# Patient Record
Sex: Female | Born: 1970 | Race: White | Hispanic: No | Smoking: Never smoker
Health system: Southern US, Community
[De-identification: ages and names within clinical notes are randomized; demographics above are authoritative.]

## PROBLEM LIST (undated history)

## (undated) DIAGNOSIS — N939 Abnormal uterine and vaginal bleeding, unspecified: Secondary | ICD-10-CM

## (undated) DIAGNOSIS — A4902 Methicillin resistant Staphylococcus aureus infection, unspecified site: Secondary | ICD-10-CM

## (undated) DIAGNOSIS — E079 Disorder of thyroid, unspecified: Secondary | ICD-10-CM

## (undated) DIAGNOSIS — Z973 Presence of spectacles and contact lenses: Secondary | ICD-10-CM

## (undated) DIAGNOSIS — I10 Essential (primary) hypertension: Secondary | ICD-10-CM

## (undated) DIAGNOSIS — N840 Polyp of corpus uteri: Secondary | ICD-10-CM

## (undated) DIAGNOSIS — D509 Iron deficiency anemia, unspecified: Secondary | ICD-10-CM

## (undated) DIAGNOSIS — E119 Type 2 diabetes mellitus without complications: Secondary | ICD-10-CM

## (undated) HISTORY — PX: TUBAL LIGATION: SHX77

---

## 2012-12-22 ENCOUNTER — Encounter (HOSPITAL_BASED_OUTPATIENT_CLINIC_OR_DEPARTMENT_OTHER): Payer: Self-pay | Admitting: *Deleted

## 2012-12-22 ENCOUNTER — Emergency Department (HOSPITAL_BASED_OUTPATIENT_CLINIC_OR_DEPARTMENT_OTHER)
Admission: EM | Admit: 2012-12-22 | Discharge: 2012-12-22 | Disposition: A | Discharge status: Home / Self Care | Payer: Self-pay | Attending: Emergency Medicine | Admitting: Emergency Medicine

## 2012-12-22 DIAGNOSIS — E079 Disorder of thyroid, unspecified: Secondary | ICD-10-CM | POA: Insufficient documentation

## 2012-12-22 DIAGNOSIS — H18891 Other specified disorders of cornea, right eye: Secondary | ICD-10-CM

## 2012-12-22 DIAGNOSIS — I10 Essential (primary) hypertension: Secondary | ICD-10-CM | POA: Insufficient documentation

## 2012-12-22 DIAGNOSIS — E119 Type 2 diabetes mellitus without complications: Secondary | ICD-10-CM | POA: Insufficient documentation

## 2012-12-22 DIAGNOSIS — Z79899 Other long term (current) drug therapy: Secondary | ICD-10-CM | POA: Insufficient documentation

## 2012-12-22 DIAGNOSIS — R51 Headache: Secondary | ICD-10-CM | POA: Insufficient documentation

## 2012-12-22 DIAGNOSIS — H18899 Other specified disorders of cornea, unspecified eye: Secondary | ICD-10-CM | POA: Insufficient documentation

## 2012-12-22 HISTORY — DX: Disorder of thyroid, unspecified: E07.9

## 2012-12-22 HISTORY — DX: Type 2 diabetes mellitus without complications: E11.9

## 2012-12-22 HISTORY — DX: Essential (primary) hypertension: I10

## 2012-12-22 MED ORDER — IBUPROFEN 800 MG PO TABS
800.0000 mg | ORAL_TABLET | Freq: Once | ORAL | Status: AC
  Administered 2012-12-22: 800 mg via ORAL
  Filled 2012-12-22: qty 1

## 2012-12-22 MED ORDER — TETRACAINE HCL 0.5 % OP SOLN
2.0000 [drp] | Freq: Once | OPHTHALMIC | Status: AC
  Administered 2012-12-22: 2 [drp] via OPHTHALMIC
  Filled 2012-12-22: qty 2

## 2012-12-22 MED ORDER — IBUPROFEN 800 MG PO TABS: 800.0000 mg | ORAL_TABLET | Freq: Three times a day (TID) | ORAL | Status: AC | PRN

## 2012-12-22 MED ORDER — FLUORESCEIN SODIUM 1 MG OP STRP
1.0000 | ORAL_STRIP | Freq: Once | OPHTHALMIC | Status: AC
  Administered 2012-12-22: 1 via OPHTHALMIC
  Filled 2012-12-22: qty 1

## 2012-12-22 MED ORDER — CIPROFLOXACIN HCL 0.3 % OP SOLN: 1.0000 [drp] | OPHTHALMIC | Status: DC

## 2012-12-22 NOTE — ED Notes (Signed)
Pt c/o right eye pain x 4 days

## 2012-12-22 NOTE — ED Provider Notes (Signed)
  CSN: [id number]     Arrival date & time 12/22/12  1824 History     First MD Initiated Contact with Patient 12/22/12 1834     Chief Complaint  Patient presents with  . Eye Pain   (Consider location/radiation/quality/duration/timing/severity/associated sxs/prior Treatment) HPI Patient presents to the emergency department with right eye pain for the last 4 days.  Patient, states, that she's not had any injury to that eye.  Patient does not wear contact lenses.  Patient, states, that there is no drainage or crusting.  Patient, states, that she didn't take any medications prior to arrival.  Patient, states, that she had a previous infection of her eye that was similar to this.  patient, states she does have a headache, associated with the pain.  Patient denies nausea, vomiting, dizziness, weakness, blurred vision, neck pain, fever, or syncope. Past Medical History  Diagnosis Date  . Diabetes mellitus without complication   . Thyroid disease   . Hypertension    History reviewed. No pertinent past surgical history. History reviewed. No pertinent family history. History  Substance Use Topics  . Smoking status: Never Smoker   . Smokeless tobacco: Not on file  . Alcohol Use: No   OB History   Grav Para Term Preterm Abortions TAB SAB Ect Mult Living                 Review of Systems All other systems negative except as documented in the HPI. All pertinent positives and negatives as reviewed in the HPI. Allergies  Shellfish allergy and Sulfa antibiotics  Home Medications   Current Outpatient Rx  Name  Route  Sig  Dispense  Refill  . levothyroxine (SYNTHROID, LEVOTHROID) 100 MCG tablet   Oral   Take 100 mcg by mouth daily before breakfast.         . lisinopril-hydrochlorothiazide (PRINZIDE,ZESTORETIC) 20-25 MG per tablet   Oral   Take 1 tablet by mouth daily.         . metFORMIN (GLUCOPHAGE) 500 MG tablet   Oral   Take 500 mg by mouth 3 (three) times daily.          BP  124/77  Pulse 100  Temp(Src) 98.8 F (37.1 C)  Resp 18  Ht 5\' 5"  (1.651 m)  Wt 165 lb (74.844 kg)  BMI 27.46 kg/m2  SpO2 100%  LMP 12/22/2012 Physical Exam  Nursing note and vitals reviewed. Constitutional: She appears well-developed and well-nourished.  HENT:  Head: Normocephalic and atraumatic.  Eyes: EOM and lids are normal. Pupils are equal, round, and reactive to light. No foreign bodies found. Right eye exhibits no discharge and no exudate. Left eye exhibits no discharge and no exudate. Right conjunctiva is injected. Left conjunctiva is not injected.  Slit lamp exam:      The right eye shows fluorescein uptake. The right eye shows no corneal ulcer, no foreign body and no hyphema.    Cardiovascular: Normal rate, regular rhythm and normal heart sounds.   Pulmonary/Chest: Effort normal and breath sounds normal.  Skin: Skin is warm and dry. No rash noted.    ED Course   Procedures (including critical care time) The patient be referred to ophthalmology for further evaluation and, care.  She'll be placed on antibiotic eyedrops.  Told to return here as needed  MDM    Carlyle Dolly, PA-C 12/22/12 1909

## 2012-12-22 NOTE — ED Provider Notes (Signed)
Medical screening examination/treatment/procedure(s) were performed by non-physician practitioner and as supervising physician I was immediately available for consultation/collaboration.   Glynn Octave, MD 12/22/12 2226

## 2012-12-22 NOTE — ED Notes (Signed)
PA at bedside using slit lamp to examine pts eye.

## 2018-08-01 ENCOUNTER — Other Ambulatory Visit: Payer: Self-pay | Admitting: Obstetrics and Gynecology

## 2018-08-04 ENCOUNTER — Other Ambulatory Visit: Payer: Self-pay

## 2018-08-04 ENCOUNTER — Ambulatory Visit (HOSPITAL_COMMUNITY)
Admission: RE | Admit: 2018-08-04 | Discharge: 2018-08-04 | Disposition: A | Discharge status: Home / Self Care | Payer: Managed Care, Other (non HMO) | Source: Ambulatory Visit | Attending: Obstetrics and Gynecology | Admitting: Obstetrics and Gynecology

## 2018-08-04 DIAGNOSIS — D509 Iron deficiency anemia, unspecified: Secondary | ICD-10-CM | POA: Diagnosis present

## 2018-08-04 MED ORDER — SODIUM CHLORIDE 0.9 % IV SOLN
INTRAVENOUS | Status: DC | PRN
  Administered 2018-08-04: 250 mL via INTRAVENOUS

## 2018-08-04 MED ORDER — SODIUM CHLORIDE 0.9 % IV SOLN
510.0000 mg | Freq: Once | INTRAVENOUS | Status: AC
  Administered 2018-08-04: 510 mg via INTRAVENOUS
  Filled 2018-08-04: qty 17

## 2018-08-04 NOTE — Discharge Instructions (Signed)

## 2018-08-04 NOTE — Progress Notes (Signed)
PATIENT CARE CENTER NOTE  Diagnosis:  Iron Deficiency Anemia    Provider: Dr. Brien Few   Procedure: IV Feraheme    Note: Patient received Feraheme infusion. Tolerated transfusion well with no adverse reaction. Observed patient for 30 minutes post-infusion. Vital signs stable. Discharge instructions given. Patient alert, oriented and ambulatory at discharge.

## 2018-08-05 ENCOUNTER — Other Ambulatory Visit: Payer: Self-pay

## 2018-08-05 ENCOUNTER — Encounter (HOSPITAL_BASED_OUTPATIENT_CLINIC_OR_DEPARTMENT_OTHER): Payer: Self-pay | Admitting: *Deleted

## 2018-08-06 ENCOUNTER — Encounter (HOSPITAL_BASED_OUTPATIENT_CLINIC_OR_DEPARTMENT_OTHER)
Admission: RE | Admit: 2018-08-06 | Discharge: 2018-08-06 | Disposition: A | Discharge status: Home / Self Care | Payer: Managed Care, Other (non HMO) | Source: Ambulatory Visit | Attending: Obstetrics and Gynecology | Admitting: Obstetrics and Gynecology

## 2018-08-06 DIAGNOSIS — D500 Iron deficiency anemia secondary to blood loss (chronic): Secondary | ICD-10-CM | POA: Diagnosis not present

## 2018-08-06 DIAGNOSIS — Z7984 Long term (current) use of oral hypoglycemic drugs: Secondary | ICD-10-CM | POA: Diagnosis not present

## 2018-08-06 DIAGNOSIS — Z01818 Encounter for other preprocedural examination: Secondary | ICD-10-CM

## 2018-08-06 DIAGNOSIS — I10 Essential (primary) hypertension: Secondary | ICD-10-CM | POA: Diagnosis not present

## 2018-08-06 DIAGNOSIS — N939 Abnormal uterine and vaginal bleeding, unspecified: Secondary | ICD-10-CM | POA: Diagnosis not present

## 2018-08-06 DIAGNOSIS — N840 Polyp of corpus uteri: Secondary | ICD-10-CM | POA: Diagnosis not present

## 2018-08-06 DIAGNOSIS — E119 Type 2 diabetes mellitus without complications: Secondary | ICD-10-CM | POA: Diagnosis not present

## 2018-08-06 LAB — BASIC METABOLIC PANEL
Anion gap: 10 (ref 5–15)
BUN: 7 mg/dL (ref 6–20)
CHLORIDE: 100 mmol/L (ref 98–111)
CO2: 27 mmol/L (ref 22–32)
CREATININE: 0.73 mg/dL (ref 0.44–1.00)
Calcium: 9.8 mg/dL (ref 8.9–10.3)
GFR calc Af Amer: >60 mL/min (ref >60)
GFR calc non Af Amer: >60 mL/min (ref >60)
GLUCOSE: 145 mg/dL — AB (ref 70–99)
Potassium: 3.7 mmol/L (ref 3.5–5.1)
SODIUM: 137 mmol/L (ref 135–145)

## 2018-08-06 LAB — CBC
HEMATOCRIT: 24.2 % — AB (ref 36.0–46.0)
Hemoglobin: 7.1 g/dL — ABNORMAL LOW (ref 12.0–15.0)
MCH: 24.1 pg — ABNORMAL LOW (ref 26.0–34.0)
MCHC: 29.3 g/dL — ABNORMAL LOW (ref 30.0–36.0)
MCV: 82.3 fL (ref 80.0–100.0)
Platelets: 370 10*3/uL (ref 150–400)
RBC: 2.94 MIL/uL — ABNORMAL LOW (ref 3.87–5.11)
RDW: 15.2 % (ref 11.5–15.5)
WBC: 7.2 10*3/uL (ref 4.0–10.5)
nRBC: 0.8 % — ABNORMAL HIGH (ref 0.0–0.2)

## 2018-08-06 LAB — POCT PREGNANCY, URINE: Preg Test, Ur: NEGATIVE

## 2018-08-06 NOTE — Progress Notes (Signed)
Lab results called to [facility] at Dr. Kennith Maes office ( Hemoglobin 7.1)

## 2018-08-07 ENCOUNTER — Other Ambulatory Visit: Payer: Self-pay | Admitting: Obstetrics and Gynecology

## 2018-08-07 NOTE — H&P (Signed)
Dorothy Bowen is an 48 y.o. female. AUB with structural lesion and continued bleeding. Hgb dropped from 10 to 7 in one mo. Surgical intervention.  Pertinent Gynecological History: Menses: flow is moderate Bleeding: dysfunctional uterine bleeding Contraception: status post hysterectomy DES exposure: denies Blood transfusions: none Sexually transmitted diseases: no past history Previous GYN Procedures: DNC  Last mammogram: normal Date: 2020 Last pap: normal Date: 2020 OB History: G4P3  Menstrual History: Menarche age: 32 Patient's last menstrual period was 05/30/2018 (approximate).    Past Medical History:  Diagnosis Date  . Diabetes mellitus without complication ([place])   . Hypertension   . Thyroid disease     Past Surgical History:  Procedure Laterality Date  . TUBAL LIGATION      History reviewed. No pertinent family history.  Social History:  reports that she has never smoked. She has never used smokeless tobacco. She reports that she does not drink alcohol or use drugs.  Allergies:  Allergies  Allergen Reactions  . Shellfish Allergy   . Sulfa Antibiotics     No medications prior to admission.    Review of Systems  Constitutional: Negative.   All other systems reviewed and are negative.   Height 5' 5"  (1.651 m), weight 76.2 kg, last menstrual period 05/30/2018. Physical Exam  Constitutional: She is oriented to person, place, and time. She appears well-developed and well-nourished.  HENT:  Head: Normocephalic and atraumatic.  Neck: Normal range of motion. Neck supple.  Cardiovascular: Normal rate and regular rhythm.  Respiratory: Effort normal and breath sounds normal.  GI: Bowel sounds are normal.  Genitourinary:    Vagina and uterus normal.   Neurological: She is alert and oriented to person, place, and time. She has normal reflexes.  Skin: Skin is warm.  Psychiatric: She has a normal mood and affect.    No results found for this or any previous  visit (from the past 24 hour(s)).  No results found.  Assessment/Plan: AUB with structural lesion and severe non responsive anemia Diag HS, Myosure, poss D&C Surgical risks discussed. Consent done.  Martavion Couper J 08/07/2018, 9:06 PM

## 2018-08-08 ENCOUNTER — Encounter (HOSPITAL_BASED_OUTPATIENT_CLINIC_OR_DEPARTMENT_OTHER)
Admission: RE | Disposition: A | Discharge status: Home / Self Care | Payer: Self-pay | Source: Home / Self Care | Attending: Obstetrics and Gynecology

## 2018-08-08 ENCOUNTER — Ambulatory Visit (HOSPITAL_BASED_OUTPATIENT_CLINIC_OR_DEPARTMENT_OTHER)
Admission: RE | Admit: 2018-08-08 | Discharge: 2018-08-08 | Disposition: A | Discharge status: Home / Self Care | Payer: Managed Care, Other (non HMO) | Attending: Obstetrics and Gynecology | Admitting: Obstetrics and Gynecology

## 2018-08-08 ENCOUNTER — Ambulatory Visit (HOSPITAL_BASED_OUTPATIENT_CLINIC_OR_DEPARTMENT_OTHER): Payer: Managed Care, Other (non HMO) | Admitting: Anesthesiology

## 2018-08-08 ENCOUNTER — Encounter (HOSPITAL_BASED_OUTPATIENT_CLINIC_OR_DEPARTMENT_OTHER): Payer: Self-pay | Admitting: *Deleted

## 2018-08-08 ENCOUNTER — Other Ambulatory Visit: Payer: Self-pay

## 2018-08-08 DIAGNOSIS — E119 Type 2 diabetes mellitus without complications: Secondary | ICD-10-CM | POA: Insufficient documentation

## 2018-08-08 DIAGNOSIS — I10 Essential (primary) hypertension: Secondary | ICD-10-CM | POA: Insufficient documentation

## 2018-08-08 DIAGNOSIS — Z7984 Long term (current) use of oral hypoglycemic drugs: Secondary | ICD-10-CM | POA: Insufficient documentation

## 2018-08-08 DIAGNOSIS — D500 Iron deficiency anemia secondary to blood loss (chronic): Secondary | ICD-10-CM | POA: Insufficient documentation

## 2018-08-08 DIAGNOSIS — N939 Abnormal uterine and vaginal bleeding, unspecified: Secondary | ICD-10-CM | POA: Diagnosis not present

## 2018-08-08 DIAGNOSIS — N840 Polyp of corpus uteri: Secondary | ICD-10-CM | POA: Insufficient documentation

## 2018-08-08 HISTORY — PX: DILATATION & CURETTAGE/HYSTEROSCOPY WITH MYOSURE: SHX6511

## 2018-08-08 HISTORY — DX: Methicillin resistant Staphylococcus aureus infection, unspecified site: A49.02

## 2018-08-08 LAB — CBC
HCT: 25.3 % — ABNORMAL LOW (ref 36.0–46.0)
Hemoglobin: 7.5 g/dL — ABNORMAL LOW (ref 12.0–15.0)
MCH: 25.3 pg — AB (ref 26.0–34.0)
MCHC: 29.6 g/dL — ABNORMAL LOW (ref 30.0–36.0)
MCV: 85.5 fL (ref 80.0–100.0)
Platelets: 362 10*3/uL (ref 150–400)
RBC: 2.96 MIL/uL — ABNORMAL LOW (ref 3.87–5.11)
RDW: 16.5 % — ABNORMAL HIGH (ref 11.5–15.5)
WBC: 9.2 10*3/uL (ref 4.0–10.5)
nRBC: 1 % — ABNORMAL HIGH (ref 0.0–0.2)

## 2018-08-08 LAB — ABO/RH: ABO/RH(D): O POS

## 2018-08-08 LAB — TYPE AND SCREEN
ABO/RH(D): O POS
Antibody Screen: NEGATIVE

## 2018-08-08 LAB — GLUCOSE, CAPILLARY: GLUCOSE-CAPILLARY: 211 mg/dL — AB (ref 70–99)

## 2018-08-08 SURGERY — DILATATION & CURETTAGE/HYSTEROSCOPY WITH MYOSURE
Anesthesia: General | Site: Uterus

## 2018-08-08 MED ORDER — SODIUM CHLORIDE 0.9 % IR SOLN
Status: DC | PRN
  Administered 2018-08-08: 2300 mL

## 2018-08-08 MED ORDER — PROPOFOL 10 MG/ML IV BOLUS
INTRAVENOUS | Status: DC | PRN
  Administered 2018-08-08: 200 mg via INTRAVENOUS

## 2018-08-08 MED ORDER — ARTIFICIAL TEARS OPHTHALMIC OINT
TOPICAL_OINTMENT | OPHTHALMIC | Status: AC
  Filled 2018-08-08: qty 3.5

## 2018-08-08 MED ORDER — BUPIVACAINE HCL (PF) 0.25 % IJ SOLN
INTRAMUSCULAR | Status: DC | PRN
  Administered 2018-08-08: 20 mL

## 2018-08-08 MED ORDER — BUPIVACAINE HCL (PF) 0.25 % IJ SOLN
INTRAMUSCULAR | Status: AC
  Filled 2018-08-08: qty 30

## 2018-08-08 MED ORDER — MIDAZOLAM HCL 2 MG/2ML IJ SOLN
INTRAMUSCULAR | Status: AC
  Filled 2018-08-08: qty 2

## 2018-08-08 MED ORDER — FENTANYL CITRATE (PF) 100 MCG/2ML IJ SOLN
INTRAMUSCULAR | Status: AC
  Filled 2018-08-08: qty 2

## 2018-08-08 MED ORDER — PROPOFOL 10 MG/ML IV BOLUS
INTRAVENOUS | Status: AC
  Filled 2018-08-08: qty 20

## 2018-08-08 MED ORDER — DEXAMETHASONE SODIUM PHOSPHATE 4 MG/ML IJ SOLN
INTRAMUSCULAR | Status: DC | PRN
  Administered 2018-08-08: 5 mg via INTRAVENOUS

## 2018-08-08 MED ORDER — SODIUM CHLORIDE 0.9 % IV SOLN: 510.0000 mg | Freq: Once | INTRAVENOUS | Status: DC

## 2018-08-08 MED ORDER — MEPERIDINE HCL 25 MG/ML IJ SOLN: 6.2500 mg | INTRAMUSCULAR | Status: DC | PRN

## 2018-08-08 MED ORDER — FENTANYL CITRATE (PF) 100 MCG/2ML IJ SOLN: 25.0000 ug | INTRAMUSCULAR | Status: DC | PRN

## 2018-08-08 MED ORDER — TRAMADOL HCL 50 MG PO TABS: 50.0000 mg | ORAL_TABLET | Freq: Four times a day (QID) | ORAL | 0 refills | Status: DC | PRN

## 2018-08-08 MED ORDER — FENTANYL CITRATE (PF) 100 MCG/2ML IJ SOLN
50.0000 ug | INTRAMUSCULAR | Status: AC | PRN
  Administered 2018-08-08: 25 ug via INTRAVENOUS
  Administered 2018-08-08: 50 ug via INTRAVENOUS
  Administered 2018-08-08: 100 ug via INTRAVENOUS
  Administered 2018-08-08: 25 ug via INTRAVENOUS

## 2018-08-08 MED ORDER — SCOPOLAMINE 1 MG/3DAYS TD PT72: 1.0000 | MEDICATED_PATCH | Freq: Once | TRANSDERMAL | Status: DC | PRN

## 2018-08-08 MED ORDER — VASOPRESSIN 20 UNIT/ML IV SOLN
INTRAVENOUS | Status: DC | PRN
  Administered 2018-08-08: 16 mL via INTRAMUSCULAR

## 2018-08-08 MED ORDER — ACETAMINOPHEN 160 MG/5ML PO SOLN: 325.0000 mg | ORAL | Status: DC | PRN

## 2018-08-08 MED ORDER — LACTATED RINGERS IV SOLN
INTRAVENOUS | Status: DC
  Administered 2018-08-08 (×2): via INTRAVENOUS

## 2018-08-08 MED ORDER — VASOPRESSIN 20 UNIT/ML IV SOLN
INTRAVENOUS | Status: AC
  Filled 2018-08-08: qty 1

## 2018-08-08 MED ORDER — LIDOCAINE 2% (20 MG/ML) 5 ML SYRINGE
INTRAMUSCULAR | Status: AC
  Filled 2018-08-08: qty 5

## 2018-08-08 MED ORDER — MIDAZOLAM HCL 2 MG/2ML IJ SOLN
1.0000 mg | INTRAMUSCULAR | Status: DC | PRN
  Administered 2018-08-08: 2 mg via INTRAVENOUS

## 2018-08-08 MED ORDER — KETOROLAC TROMETHAMINE 30 MG/ML IJ SOLN
INTRAMUSCULAR | Status: DC | PRN
  Administered 2018-08-08: 30 mg via INTRAVENOUS

## 2018-08-08 MED ORDER — OXYCODONE HCL 5 MG/5ML PO SOLN: 5.0000 mg | Freq: Once | ORAL | Status: DC | PRN

## 2018-08-08 MED ORDER — CEFAZOLIN SODIUM-DEXTROSE 2-4 GM/100ML-% IV SOLN
2.0000 g | INTRAVENOUS | Status: AC
  Administered 2018-08-08: 2 g via INTRAVENOUS

## 2018-08-08 MED ORDER — ONDANSETRON HCL 4 MG/2ML IJ SOLN
4.0000 mg | Freq: Once | INTRAMUSCULAR | Status: AC | PRN
  Administered 2018-08-08: 4 mg via INTRAVENOUS

## 2018-08-08 MED ORDER — CEFAZOLIN SODIUM-DEXTROSE 2-4 GM/100ML-% IV SOLN
INTRAVENOUS | Status: AC
  Filled 2018-08-08: qty 100

## 2018-08-08 MED ORDER — ACETAMINOPHEN 325 MG PO TABS: 325.0000 mg | ORAL_TABLET | ORAL | Status: DC | PRN

## 2018-08-08 MED ORDER — SILVER NITRATE-POT NITRATE 75-25 % EX MISC
CUTANEOUS | Status: AC
  Filled 2018-08-08: qty 1

## 2018-08-08 MED ORDER — ONDANSETRON HCL 4 MG/2ML IJ SOLN
INTRAMUSCULAR | Status: AC
  Filled 2018-08-08: qty 2

## 2018-08-08 MED ORDER — OXYCODONE HCL 5 MG PO TABS: 5.0000 mg | ORAL_TABLET | Freq: Once | ORAL | Status: DC | PRN

## 2018-08-08 MED ORDER — DEXAMETHASONE SODIUM PHOSPHATE 10 MG/ML IJ SOLN
INTRAMUSCULAR | Status: AC
  Filled 2018-08-08: qty 1

## 2018-08-08 MED ORDER — LIDOCAINE HCL (CARDIAC) PF 100 MG/5ML IV SOSY
PREFILLED_SYRINGE | INTRAVENOUS | Status: DC | PRN
  Administered 2018-08-08: 50 mg via INTRAVENOUS

## 2018-08-08 MED ORDER — KETOROLAC TROMETHAMINE 30 MG/ML IJ SOLN
INTRAMUSCULAR | Status: AC
  Filled 2018-08-08: qty 1

## 2018-08-08 SURGICAL SUPPLY — 18 items
CATH ROBINSON RED A/P 16FR (CATHETERS) ×2 IMPLANT
DECANTER SPIKE VIAL GLASS SM (MISCELLANEOUS) IMPLANT
DEVICE MYOSURE LITE (MISCELLANEOUS) ×2 IMPLANT
DEVICE MYOSURE REACH (MISCELLANEOUS) IMPLANT
GLOVE BIO SURGEON STRL SZ7.5 (GLOVE) ×2 IMPLANT
GLOVE BIOGEL PI IND STRL 7.0 (GLOVE) ×1 IMPLANT
GLOVE BIOGEL PI INDICATOR 7.0 (GLOVE) ×1
GOWN STRL REUS W/ TWL LRG LVL3 (GOWN DISPOSABLE) ×2 IMPLANT
GOWN STRL REUS W/TWL LRG LVL3 (GOWN DISPOSABLE) ×2
HIBICLENS CHG 4% 4OZ BTL (MISCELLANEOUS) ×2 IMPLANT
KIT PROCEDURE FLUENT (KITS) ×2 IMPLANT
NEEDLE SPNL 22GX3.5 QUINCKE BK (NEEDLE) ×2 IMPLANT
PACK VAGINAL MINOR WOMEN LF (CUSTOM PROCEDURE TRAY) ×2 IMPLANT
PAD OB MATERNITY 4.3X12.25 (PERSONAL CARE ITEMS) ×2 IMPLANT
SEAL ROD LENS SCOPE MYOSURE (ABLATOR) ×2 IMPLANT
SYR CONTROL 10ML LL (SYRINGE) ×2 IMPLANT
SYR TB 1ML 25GX5/8 (SYRINGE) ×2 IMPLANT
TOWEL OR 17X24 6PK STRL BLUE (TOWEL DISPOSABLE) ×4 IMPLANT

## 2018-08-08 NOTE — H&P (Signed)
Patient seen and examined. Consent witnessed and signed. No changes noted. Update completed. BP 101/74   Pulse (!) 106   Temp 98.5 F (36.9 C) (Oral)   Resp 18   Ht 5' 5"  (1.651 m)   Wt 77.4 kg   LMP 05/30/2018 (Approximate) Comment: 'has not stopped'  SpO2 100%   BMI 28.40 kg/m   CBC    Component Value Date/Time   WBC 9.2 08/08/2018 0943   RBC 2.96 (L) 08/08/2018 0943   HGB 7.5 (L) 08/08/2018 0943   HCT 25.3 (L) 08/08/2018 0943   PLT 362 08/08/2018 0943   MCV 85.5 08/08/2018 0943   MCH 25.3 (L) 08/08/2018 0943   MCHC 29.6 (L) 08/08/2018 0943   RDW 16.5 (H) 08/08/2018 0943  \

## 2018-08-08 NOTE — Op Note (Signed)
08/08/2018  12:15 PM  PATIENT:  Dorothy Bowen  48 y.o. female  PRE-OPERATIVE DIAGNOSIS:  Abnormal Uterine Bleeding  POST-OPERATIVE DIAGNOSIS:  Abnormal Uterine Bleeding  PROCEDURE:  Procedure(s): DILATATION & CURETTAGE/HYSTEROSCOPY WITH MYOSURE/ENDOMETRIAL POLYPECTOMY  SURGEON:  Surgeon(s): Brien Few, MD  ASSISTANTS: none   ANESTHESIA:   local and general  ESTIMATED BLOOD LOSS: minimal Fluid deficit: 150cc  DRAINS: none   LOCAL MEDICATIONS USED:  MARCAINE    and Amount: 20 ml  SPECIMEN:  Source of Specimen:  emc and polyp  DISPOSITION OF SPECIMEN:  PATHOLOGY  COUNTS:  YES  DICTATION #: [id number]  PLAN OF CARE: dc home  PATIENT DISPOSITION:  PACU - hemodynamically stable.

## 2018-08-08 NOTE — Op Note (Signed)
Dorothy Bowen, Dorothy Bowen MEDICAL RECORD [id number] ACCOUNT [id number] DATE OF BIRTH:1971-04-07 FACILITY: [facility] LOCATION: [facility] PHYSICIAN:Euleta Belson J. Zaryan Yakubov, MD  OPERATIVE REPORT  DATE OF PROCEDURE:  08/08/2018  PREOPERATIVE DIAGNOSIS:  Abnormal uterine bleeding with secondary anemia.  POSTOPERATIVE DIAGNOSIS:  Endometrial polyp.  PROCEDURE:  Diagnostic hysteroscopy, dilatation and curettage, MyoSure resection of endometrial polyp.  SURGEON:  Brien Few, MD  ASSISTANT:  None.  ANESTHESIA:  General, local.  ESTIMATED BLOOD LOSS:  Less than 50 mL.  FLUID DEFICIT:  150 mL.  COMPLICATIONS:  None.  DRAINS:  None.  COUNTS:  Correct.  DISPOSITION:  The patient to recovery in good condition.  BRIEF OPERATIVE NOTE:  After being apprised of the risks of anesthesia, infection, bleeding, injury to surrounding organs, possible need for repair, delayed versus immediate complications including bowel and bladder injury, possible need for repair, the  patient was brought to the operating room where she was administered general anesthetic without complications.  Prepped and draped in usual sterile fashion and catheterized until the bladder was empty.  Exam under anesthesia revealed a bulky anteflexed  uterus and no adnexal masses.  Dilute Pitressin solution placed 3 and 9 o'clock 16 mL total dilute Marcaine solution placed.  Standard paracervical block 20 mL total.  Cervix easily dilated up to a #21 Pratt dilator.  Hysteroscope placed.  Visualization  revealed 2 sessile endometrial polyps which were resected with the MyoSure in their entirety.  Good hemostasis was noted.  D and C performed using sharp curettage in a 4-quadrant method.  Procedure was terminated.  Fluid deficit as noted.  The patient  tolerated the procedure well and was transferred to recovery in good condition.  LN/NUANCE  D:08/08/2018 T:08/08/2018 JOB:[id number]

## 2018-08-08 NOTE — Anesthesia Preprocedure Evaluation (Signed)
Anesthesia Evaluation  Patient identified by MRN, date of birth, ID band Patient awake    Reviewed: Allergy & Precautions, H&P , NPO status , Patient's Chart, lab work & pertinent test results, reviewed documented beta blocker date and time   Airway Mallampati: II  TM Distance: >3 FB Neck ROM: full    Dental no notable dental hx. (+) Teeth Intact   Pulmonary neg pulmonary ROS,    Pulmonary exam normal breath sounds clear to auscultation       Cardiovascular Exercise Tolerance: Good hypertension, Pt. on medications Normal cardiovascular exam Rhythm:regular Rate:Normal     Neuro/Psych negative neurological ROS  negative psych ROS   GI/Hepatic negative GI ROS, Neg liver ROS,   Endo/Other  diabetes, Oral Hypoglycemic AgentsHypothyroidism   Renal/GU negative Renal ROS  negative genitourinary   Musculoskeletal   Abdominal   Peds  Hematology negative hematology ROS (+)   Anesthesia Other Findings   Reproductive/Obstetrics negative OB ROS                             Anesthesia Physical Anesthesia Plan  ASA: III  Anesthesia Plan: General   Post-op Pain Management:    Induction: Intravenous  PONV Risk Score and Plan: 3 and Ondansetron, Treatment may vary due to age or medical condition and Midazolam  Airway Management Planned: LMA  Additional Equipment:   Intra-op Plan:   Post-operative Plan:   Informed Consent: I have reviewed the patients History and Physical, chart, labs and discussed the procedure including the risks, benefits and alternatives for the proposed anesthesia with the patient or authorized representative who has indicated his/her understanding and acceptance.     Dental Advisory Given  Plan Discussed with: CRNA, Anesthesiologist and Surgeon  Anesthesia Plan Comments: ( )        Anesthesia Quick Evaluation

## 2018-08-08 NOTE — Transfer of Care (Signed)
Immediate Anesthesia Transfer of Care Note  Patient: Dorothy Bowen  Procedure(s) Performed: DILATATION & CURETTAGE/HYSTEROSCOPY WITH MYOSURE/ENDOMETRIAL POLYPECTOMY (N/A Uterus)  Patient Location: PACU  Anesthesia Type:General  Level of Consciousness: awake, alert , oriented and patient cooperative  Airway & Oxygen Therapy: Patient Spontanous Breathing and Patient connected to face mask oxygen  Post-op Assessment: Report given to RN and Post -op Vital signs reviewed and stable  Post vital signs: Reviewed and stable  Last Vitals:  Vitals Value Taken Time  BP 122/76 08/08/2018 12:20 PM  Temp    Pulse 86 08/08/2018 12:21 PM  Resp 16 08/08/2018 12:21 PM  SpO2 100 % 08/08/2018 12:21 PM  Vitals shown include unvalidated device data.  Last Pain:  Vitals:   08/08/18 0934  TempSrc: Oral  PainSc: 0-No pain      Patients Stated Pain Goal: 0 (89/37/34 2876)  Complications: No apparent anesthesia complications

## 2018-08-08 NOTE — Discharge Instructions (Signed)
DO NOT TAKE IBUPROFEN until 6:15 p.m. Post Anesthesia Home Care Instructions  Activity: Get plenty of rest for the remainder of the day. A responsible individual must stay with you for 24 hours following the procedure.  For the next 24 hours, DO NOT: -Drive a car -Paediatric nurse -Drink alcoholic beverages -Take any medication unless instructed by your physician -Make any legal decisions or sign important papers.  Meals: Start with liquid foods such as gelatin or soup. Progress to regular foods as tolerated. Avoid greasy, spicy, heavy foods. If nausea and/or vomiting occur, drink only clear liquids until the nausea and/or vomiting subsides. Call your physician if vomiting continues.  Special Instructions/Symptoms: Your throat may feel dry or sore from the anesthesia or the breathing tube placed in your throat during surgery. If this causes discomfort, gargle with warm salt water. The discomfort should disappear within 24 hours.  If you had a scopolamine patch placed behind your ear for the management of post- operative nausea and/or vomiting:  1. The medication in the patch is effective for 72 hours, after which it should be removed.  Wrap patch in a tissue and discard in the trash. Wash hands thoroughly with soap and water. 2. You may remove the patch earlier than 72 hours if you experience unpleasant side effects which may include dry mouth, dizziness or visual disturbances. 3. Avoid touching the patch. Wash your hands with soap and water after contact with the patch.

## 2018-08-08 NOTE — Anesthesia Procedure Notes (Signed)
Procedure Name: LMA Insertion Date/Time: 08/08/2018 11:48 AM Performed by: Wanita Chamberlain, CRNA

## 2018-08-10 NOTE — Anesthesia Postprocedure Evaluation (Signed)
Anesthesia Post Note  Patient: Dorothy Bowen  Procedure(s) Performed: DILATATION & CURETTAGE/HYSTEROSCOPY WITH MYOSURE/ENDOMETRIAL POLYPECTOMY (N/A Uterus)     Patient location during evaluation: PACU Anesthesia Type: General Level of consciousness: awake and alert Pain management: pain level controlled Vital Signs Assessment: post-procedure vital signs reviewed and stable Respiratory status: spontaneous breathing, nonlabored ventilation, respiratory function stable and patient connected to nasal cannula oxygen Cardiovascular status: blood pressure returned to baseline and stable Postop Assessment: no apparent nausea or vomiting Anesthetic complications: no    Last Vitals:  Vitals:   08/08/18 1300 08/08/18 1315  BP: 120/71 111/79  Pulse: 79 86  Resp: 17 16  Temp:  36.5 C  SpO2: 95% 98%    Last Pain:  Vitals:   08/08/18 1315  TempSrc:   PainSc: 0-No pain                 Laneshia Pina

## 2018-08-11 ENCOUNTER — Encounter (HOSPITAL_COMMUNITY): Payer: Self-pay

## 2018-08-12 ENCOUNTER — Encounter (HOSPITAL_BASED_OUTPATIENT_CLINIC_OR_DEPARTMENT_OTHER): Payer: Self-pay | Admitting: Obstetrics and Gynecology

## 2018-08-12 NOTE — Addendum Note (Signed)
Addendum  created 08/12/18 0809 by Mckenzey Parcell, Ernesta Amble, CRNA   Charge Capture section accepted

## 2018-10-23 ENCOUNTER — Other Ambulatory Visit: Payer: Self-pay | Admitting: Obstetrics and Gynecology

## 2018-10-31 NOTE — Progress Notes (Signed)
Pt is getting rapid covid test done dos @0900 ,  Approved by Evonnie Pat. Called and spoke w/ pt made covid appointment for 0900, advised pt she to arrive at [place] that morning at testing site and wait in her for results.

## 2018-11-04 ENCOUNTER — Other Ambulatory Visit: Payer: Self-pay

## 2018-11-04 ENCOUNTER — Encounter (HOSPITAL_BASED_OUTPATIENT_CLINIC_OR_DEPARTMENT_OTHER): Payer: Self-pay

## 2018-11-04 NOTE — Progress Notes (Signed)
Spoke with:  Camilia NPO:  No food after midnight/Clear liquids until 7:30AM DOS Arrival time:  1130AM (arriving at 8:45AM to have Rapid COVID) Labs: CBC, BMP, T&S, EKG AM medications: Levothyroxine Pre op orders: Yes Ride home: [place] [phone]

## 2018-11-06 ENCOUNTER — Ambulatory Visit (HOSPITAL_BASED_OUTPATIENT_CLINIC_OR_DEPARTMENT_OTHER): Payer: Managed Care, Other (non HMO) | Admitting: Anesthesiology

## 2018-11-06 ENCOUNTER — Other Ambulatory Visit (HOSPITAL_COMMUNITY)
Admission: RE | Admit: 2018-11-06 | Discharge: 2018-11-06 | Disposition: A | Discharge status: Home / Self Care | Payer: Managed Care, Other (non HMO) | Source: Ambulatory Visit | Attending: Obstetrics and Gynecology | Admitting: Obstetrics and Gynecology

## 2018-11-06 ENCOUNTER — Other Ambulatory Visit: Payer: Self-pay

## 2018-11-06 ENCOUNTER — Ambulatory Visit (HOSPITAL_BASED_OUTPATIENT_CLINIC_OR_DEPARTMENT_OTHER)
Admission: RE | Admit: 2018-11-06 | Discharge: 2018-11-06 | Disposition: A | Discharge status: Home / Self Care | Payer: Managed Care, Other (non HMO) | Attending: Obstetrics and Gynecology | Admitting: Obstetrics and Gynecology

## 2018-11-06 ENCOUNTER — Encounter (HOSPITAL_BASED_OUTPATIENT_CLINIC_OR_DEPARTMENT_OTHER): Payer: Self-pay

## 2018-11-06 ENCOUNTER — Encounter (HOSPITAL_BASED_OUTPATIENT_CLINIC_OR_DEPARTMENT_OTHER)
Admission: RE | Disposition: A | Discharge status: Home / Self Care | Payer: Self-pay | Source: Home / Self Care | Attending: Obstetrics and Gynecology

## 2018-11-06 DIAGNOSIS — I10 Essential (primary) hypertension: Secondary | ICD-10-CM | POA: Diagnosis not present

## 2018-11-06 DIAGNOSIS — E119 Type 2 diabetes mellitus without complications: Secondary | ICD-10-CM | POA: Insufficient documentation

## 2018-11-06 DIAGNOSIS — N938 Other specified abnormal uterine and vaginal bleeding: Secondary | ICD-10-CM | POA: Insufficient documentation

## 2018-11-06 DIAGNOSIS — D509 Iron deficiency anemia, unspecified: Secondary | ICD-10-CM | POA: Insufficient documentation

## 2018-11-06 DIAGNOSIS — Z91013 Allergy to seafood: Secondary | ICD-10-CM | POA: Diagnosis not present

## 2018-11-06 DIAGNOSIS — Z882 Allergy status to sulfonamides status: Secondary | ICD-10-CM | POA: Insufficient documentation

## 2018-11-06 DIAGNOSIS — Z881 Allergy status to other antibiotic agents status: Secondary | ICD-10-CM | POA: Insufficient documentation

## 2018-11-06 DIAGNOSIS — Z79890 Hormone replacement therapy (postmenopausal): Secondary | ICD-10-CM | POA: Insufficient documentation

## 2018-11-06 DIAGNOSIS — E079 Disorder of thyroid, unspecified: Secondary | ICD-10-CM | POA: Diagnosis not present

## 2018-11-06 DIAGNOSIS — Z7984 Long term (current) use of oral hypoglycemic drugs: Secondary | ICD-10-CM | POA: Insufficient documentation

## 2018-11-06 DIAGNOSIS — Z1159 Encounter for screening for other viral diseases: Secondary | ICD-10-CM | POA: Insufficient documentation

## 2018-11-06 DIAGNOSIS — D649 Anemia, unspecified: Secondary | ICD-10-CM | POA: Diagnosis present

## 2018-11-06 HISTORY — DX: Abnormal uterine and vaginal bleeding, unspecified: N93.9

## 2018-11-06 HISTORY — PX: DILITATION & CURRETTAGE/HYSTROSCOPY WITH NOVASURE ABLATION: SHX5568

## 2018-11-06 HISTORY — DX: Presence of spectacles and contact lenses: Z97.3

## 2018-11-06 HISTORY — DX: Polyp of corpus uteri: N84.0

## 2018-11-06 HISTORY — DX: Iron deficiency anemia, unspecified: D50.9

## 2018-11-06 LAB — CBC
HCT: 32.4 % — ABNORMAL LOW (ref 36.0–46.0)
Hemoglobin: 9.5 g/dL — ABNORMAL LOW (ref 12.0–15.0)
MCH: 24 pg — ABNORMAL LOW (ref 26.0–34.0)
MCHC: 29.3 g/dL — ABNORMAL LOW (ref 30.0–36.0)
MCV: 81.8 fL (ref 80.0–100.0)
Platelets: 314 10*3/uL (ref 150–400)
RBC: 3.96 MIL/uL (ref 3.87–5.11)
RDW: 17.2 % — ABNORMAL HIGH (ref 11.5–15.5)
WBC: 6.9 10*3/uL (ref 4.0–10.5)
nRBC: 0 % (ref 0.0–0.2)

## 2018-11-06 LAB — BASIC METABOLIC PANEL
Anion gap: 8 (ref 5–15)
BUN: 11 mg/dL (ref 6–20)
CO2: 24 mmol/L (ref 22–32)
Calcium: 9 mg/dL (ref 8.9–10.3)
Chloride: 104 mmol/L (ref 98–111)
Creatinine, Ser: 0.68 mg/dL (ref 0.44–1.00)
GFR calc Af Amer: >60 mL/min (ref >60)
GFR calc non Af Amer: >60 mL/min (ref >60)
Glucose, Bld: 158 mg/dL — ABNORMAL HIGH (ref 70–99)
Potassium: 4.1 mmol/L (ref 3.5–5.1)
Sodium: 136 mmol/L (ref 135–145)

## 2018-11-06 LAB — SARS CORONAVIRUS 2 BY RT PCR (HOSPITAL ORDER, PERFORMED IN ~~LOC~~ HOSPITAL LAB): SARS Coronavirus 2: NEGATIVE

## 2018-11-06 LAB — TYPE AND SCREEN
ABO/RH(D): O POS
Antibody Screen: NEGATIVE

## 2018-11-06 LAB — ABO/RH: ABO/RH(D): O POS

## 2018-11-06 LAB — GLUCOSE, CAPILLARY: Glucose-Capillary: 139 mg/dL — ABNORMAL HIGH (ref 70–99)

## 2018-11-06 SURGERY — DILATATION & CURETTAGE/HYSTEROSCOPY WITH NOVASURE ABLATION
Anesthesia: General | Site: Vagina

## 2018-11-06 MED ORDER — PROPOFOL 10 MG/ML IV BOLUS
INTRAVENOUS | Status: DC | PRN
  Administered 2018-11-06: 150 mg via INTRAVENOUS

## 2018-11-06 MED ORDER — ACETAMINOPHEN 500 MG PO TABS
ORAL_TABLET | ORAL | Status: AC
  Filled 2018-11-06: qty 2

## 2018-11-06 MED ORDER — CEFAZOLIN SODIUM-DEXTROSE 2-4 GM/100ML-% IV SOLN
2.0000 g | INTRAVENOUS | Status: AC
  Administered 2018-11-06: 2 g via INTRAVENOUS
  Filled 2018-11-06: qty 100

## 2018-11-06 MED ORDER — DEXAMETHASONE SODIUM PHOSPHATE 10 MG/ML IJ SOLN
INTRAMUSCULAR | Status: AC
  Filled 2018-11-06: qty 1

## 2018-11-06 MED ORDER — LIDOCAINE 2% (20 MG/ML) 5 ML SYRINGE
INTRAMUSCULAR | Status: DC | PRN
  Administered 2018-11-06: 60 mg via INTRAVENOUS

## 2018-11-06 MED ORDER — ONDANSETRON HCL 4 MG/2ML IJ SOLN
INTRAMUSCULAR | Status: AC
  Filled 2018-11-06: qty 2

## 2018-11-06 MED ORDER — HYDROMORPHONE HCL 1 MG/ML IJ SOLN
0.2500 mg | INTRAMUSCULAR | Status: DC | PRN
  Filled 2018-11-06: qty 0.5

## 2018-11-06 MED ORDER — LACTATED RINGERS IV SOLN
INTRAVENOUS | Status: DC
  Administered 2018-11-06: 50 mL/h via INTRAVENOUS
  Filled 2018-11-06: qty 1000

## 2018-11-06 MED ORDER — FENTANYL CITRATE (PF) 100 MCG/2ML IJ SOLN
INTRAMUSCULAR | Status: AC
  Filled 2018-11-06: qty 2

## 2018-11-06 MED ORDER — SCOPOLAMINE 1 MG/3DAYS TD PT72
1.0000 | MEDICATED_PATCH | Freq: Once | TRANSDERMAL | Status: DC
  Administered 2018-11-06: 1.5 mg via TRANSDERMAL
  Filled 2018-11-06: qty 1

## 2018-11-06 MED ORDER — SODIUM CHLORIDE 0.9 % IR SOLN
Status: DC | PRN
  Administered 2018-11-06: 3000 mL

## 2018-11-06 MED ORDER — PROPOFOL 10 MG/ML IV BOLUS
INTRAVENOUS | Status: AC
  Filled 2018-11-06: qty 20

## 2018-11-06 MED ORDER — MIDAZOLAM HCL 2 MG/2ML IJ SOLN
INTRAMUSCULAR | Status: AC
  Filled 2018-11-06: qty 2

## 2018-11-06 MED ORDER — LIDOCAINE 2% (20 MG/ML) 5 ML SYRINGE
INTRAMUSCULAR | Status: AC
  Filled 2018-11-06: qty 5

## 2018-11-06 MED ORDER — BUPIVACAINE HCL (PF) 0.25 % IJ SOLN
INTRAMUSCULAR | Status: DC | PRN
  Administered 2018-11-06: 20 mL

## 2018-11-06 MED ORDER — SCOPOLAMINE 1 MG/3DAYS TD PT72
MEDICATED_PATCH | TRANSDERMAL | Status: AC
  Filled 2018-11-06: qty 1

## 2018-11-06 MED ORDER — MEPERIDINE HCL 25 MG/ML IJ SOLN
6.2500 mg | INTRAMUSCULAR | Status: DC | PRN
  Filled 2018-11-06: qty 1

## 2018-11-06 MED ORDER — KETOROLAC TROMETHAMINE 30 MG/ML IJ SOLN
INTRAMUSCULAR | Status: DC | PRN
  Administered 2018-11-06: 30 mg via INTRAVENOUS

## 2018-11-06 MED ORDER — CEFAZOLIN SODIUM-DEXTROSE 2-4 GM/100ML-% IV SOLN
INTRAVENOUS | Status: AC
  Filled 2018-11-06: qty 100

## 2018-11-06 MED ORDER — TRAMADOL HCL 50 MG PO TABS: 50.0000 mg | ORAL_TABLET | Freq: Four times a day (QID) | ORAL | 0 refills | Status: DC | PRN

## 2018-11-06 MED ORDER — DEXAMETHASONE SODIUM PHOSPHATE 4 MG/ML IJ SOLN
INTRAMUSCULAR | Status: DC | PRN
  Administered 2018-11-06: 5 mg via INTRAVENOUS

## 2018-11-06 MED ORDER — KETOROLAC TROMETHAMINE 30 MG/ML IJ SOLN
INTRAMUSCULAR | Status: AC
  Filled 2018-11-06: qty 1

## 2018-11-06 MED ORDER — PROMETHAZINE HCL 25 MG/ML IJ SOLN
6.2500 mg | INTRAMUSCULAR | Status: DC | PRN
  Filled 2018-11-06: qty 1

## 2018-11-06 MED ORDER — ACETAMINOPHEN 500 MG PO TABS
1000.0000 mg | ORAL_TABLET | Freq: Once | ORAL | Status: AC
  Administered 2018-11-06: 1000 mg via ORAL
  Filled 2018-11-06: qty 2

## 2018-11-06 MED ORDER — FENTANYL CITRATE (PF) 100 MCG/2ML IJ SOLN
INTRAMUSCULAR | Status: DC | PRN
  Administered 2018-11-06 (×2): 50 ug via INTRAVENOUS

## 2018-11-06 MED ORDER — MIDAZOLAM HCL 5 MG/5ML IJ SOLN
INTRAMUSCULAR | Status: DC | PRN
  Administered 2018-11-06: 2 mg via INTRAVENOUS

## 2018-11-06 MED ORDER — ONDANSETRON HCL 4 MG/2ML IJ SOLN
INTRAMUSCULAR | Status: DC | PRN
  Administered 2018-11-06: 4 mg via INTRAVENOUS

## 2018-11-06 MED ORDER — MIDAZOLAM HCL 2 MG/2ML IJ SOLN
0.5000 mg | Freq: Once | INTRAMUSCULAR | Status: DC | PRN
  Filled 2018-11-06: qty 2

## 2018-11-06 SURGICAL SUPPLY — 18 items
ABLATOR SURESOUND NOVASURE (ABLATOR) ×3 IMPLANT
CATH ROBINSON RED A/P 16FR (CATHETERS) ×3 IMPLANT
GLOVE BIO SURGEON STRL SZ 6.5 (GLOVE) ×1 IMPLANT
GLOVE BIO SURGEON STRL SZ7.5 (GLOVE) ×3 IMPLANT
GLOVE BIO SURGEONS STRL SZ 6.5 (GLOVE) ×1
GLOVE BIOGEL PI IND STRL 6.5 (GLOVE) IMPLANT
GLOVE BIOGEL PI IND STRL 7.0 (GLOVE) ×1 IMPLANT
GLOVE BIOGEL PI INDICATOR 6.5 (GLOVE) ×2
GLOVE BIOGEL PI INDICATOR 7.0 (GLOVE) ×2
GOWN STRL REUS W/ TWL LRG LVL3 (GOWN DISPOSABLE) ×2 IMPLANT
GOWN STRL REUS W/TWL LRG LVL3 (GOWN DISPOSABLE) ×4
HIBICLENS CHG 4% 4OZ BTL (MISCELLANEOUS) ×1 IMPLANT
KIT PROCEDURE FLUENT (KITS) ×3 IMPLANT
PACK VAGINAL MINOR WOMEN LF (CUSTOM PROCEDURE TRAY) ×3 IMPLANT
PAD OB MATERNITY 4.3X12.25 (PERSONAL CARE ITEMS) ×3 IMPLANT
PAD PREP 24X48 CUFFED NSTRL (MISCELLANEOUS) ×3 IMPLANT
SYRINGE 1CC 25X5/8 TB ECLIPSE (MISCELLANEOUS) ×3 IMPLANT
TOWEL OR 17X24 6PK STRL BLUE (TOWEL DISPOSABLE) ×4 IMPLANT

## 2018-11-06 NOTE — Op Note (Signed)
11/06/2018  1:58 PM  PATIENT:  Dorothy Bowen  48 y.o. female  PRE-OPERATIVE DIAGNOSIS:  Abnormal Uterine Bleeding Anemia  POST-OPERATIVE DIAGNOSIS:  Same and post endo polyp  PROCEDURE:  Procedure(s): DILATATION & CURETTAGE HYSTEROSCOPY WITH NOVASURE ABLATION ENDOMETRIAL POLYPECTOMY  SURGEON:  Surgeon(s): Brien Few, MD  ASSISTANTS: none   ANESTHESIA:   local and general  ESTIMATED BLOOD LOSS: MINIMAL  DRAINS: none   LOCAL MEDICATIONS USED:  MARCAINE    and Amount: 20 ml  SPECIMEN:  Source of Specimen:  [facility] AND POLYP  DISPOSITION OF SPECIMEN:  PATHOLOGY  COUNTS:  YES  DICTATION #: [id number]  PLAN OF CARE: DC HOME  PATIENT DISPOSITION:  PACU - hemodynamically stable.

## 2018-11-06 NOTE — Transfer of Care (Signed)
  Last Vitals:  Vitals Value Taken Time  BP 121/60 11/06/18 1415  Temp 37.1 C 11/06/18 1409  Pulse 73 11/06/18 1420  Resp 19 11/06/18 1420  SpO2 100 % 11/06/18 1420  Vitals shown include unvalidated device data.  Last Pain:  Vitals:   11/06/18 1409  TempSrc:   PainSc: 0-No pain      Patients Stated Pain Goal: 5 (11/06/18 1105) Immediate Anesthesia Transfer of Care Note  Patient: Dorothy Bowen  Procedure(s) Performed: Procedure(s) (LRB): DILATATION & CURETTAGE/HYSTEROSCOPY WITH NOVASURE ABLATION (N/A)  Patient Location: PACU  Anesthesia Type: General  Level of Consciousness: awake, alert  and oriented  Airway & Oxygen Therapy: Patient Spontanous Breathing and Patient connected to nasal cannula oxygen  Post-op Assessment: Report given to PACU RN and Post -op Vital signs reviewed and stable  Post vital signs: Reviewed and stable  Complications: No apparent anesthesia complications

## 2018-11-06 NOTE — Anesthesia Procedure Notes (Signed)
Procedure Name: LMA Insertion Date/Time: 11/06/2018 1:39 PM Performed by: Annye Asa, MD Pre-anesthesia Checklist: Patient identified, Emergency Drugs available, Suction available and Patient being monitored Patient Re-evaluated:Patient Re-evaluated prior to induction Oxygen Delivery Method: Circle system utilized Preoxygenation: Pre-oxygenation with 100% oxygen Induction Type: IV induction Ventilation: Mask ventilation without difficulty LMA: LMA inserted LMA Size: 4.0 Number of attempts: 1 Airway Equipment and Method: Bite block Placement Confirmation: positive ETCO2 Tube secured with: Tape Dental Injury: Teeth and Oropharynx as per pre-operative assessment

## 2018-11-06 NOTE — Discharge Instructions (Signed)
° °  D & C Home care Instructions:   Personal hygiene:  Used sanitary napkins for vaginal drainage not tampons. Shower or tub bathe the day after your procedure. No douching until bleeding stops. Always wipe from front to back after  Elimination.  Activity: Do not drive or operate any equipment today. The effects of the anesthesia are still present and drowsiness may result. Rest today, not necessarily flat bed rest, just take it easy. You may resume your normal activity in one to 2 days.  Sexual activity: No intercourse for one week or as indicated by your physician  Diet: Eat a light diet as desired this evening. You may resume a regular diet tomorrow.  Return to work: One to 2 days.  General Expectations of your surgery: Vaginal bleeding should be no heavier than a normal period. Spotting may continue up to 10 days. Mild cramps may continue for a couple of days. You may have a regular period in 2-6 weeks.  Unexpected observations call your doctor if these occur: persistent or heavy bleeding. Severe abdominal cramping or pain. Elevation of temperature greater than 100F.   Post Anesthesia Home Care Instructions  Activity: Get plenty of rest for the remainder of the day. A responsible individual must stay with you for 24 hours following the procedure.  For the next 24 hours, DO NOT: -Drive a car -Paediatric nurse -Drink alcoholic beverages -Take any medication unless instructed by your physician -Make any legal decisions or sign important papers.  Meals: Start with liquid foods such as gelatin or soup. Progress to regular foods as tolerated. Avoid greasy, spicy, heavy foods. If nausea and/or vomiting occur, drink only clear liquids until the nausea and/or vomiting subsides. Call your physician if vomiting continues.  Special Instructions/Symptoms: Your throat may feel dry or sore from the anesthesia or the breathing tube placed in your throat during surgery. If this causes  discomfort, gargle with warm salt water. The discomfort should disappear within 24 hours.  If you had a scopolamine patch placed behind your ear for the management of post- operative nausea and/or vomiting:  1. The medication in the patch is effective for 72 hours, after which it should be removed.  Wrap patch in a tissue and discard in the trash. Wash hands thoroughly with soap and water. 2. You may remove the patch earlier than 72 hours if you experience unpleasant side effects which may include dry mouth, dizziness or visual disturbances. 3. Avoid touching the patch. Wash your hands with soap and water after contact with the patch.

## 2018-11-06 NOTE — H&P (Signed)
Dorothy Bowen is an 48 y.o. female. Refractory AUB. For EAB  Pertinent Gynecological History: Menses: flow is moderate Bleeding: dysfunctional uterine bleeding Contraception: none DES exposure: denies Blood transfusions: none Sexually transmitted diseases: no past history Previous GYN Procedures: DNC  Last mammogram: normal Date: 2020 Last pap: normal Date: 2020 OB History: G2, P2   Menstrual History: Menarche age: 21 Patient's last menstrual period was 09/18/2018.    Past Medical History:  Diagnosis Date  . Abnormal uterine bleeding (AUB)   . Diabetes mellitus without complication ([facility])   . Endometrial polyp   . Hypertension   . Iron deficiency anemia   . MRSA infection    right hip from a spider bite, approx 13 years ago  . Thyroid disease   . Wears glasses     Past Surgical History:  Procedure Laterality Date  . DILATATION & CURETTAGE/HYSTEROSCOPY WITH MYOSURE N/A 08/08/2018   Procedure: DILATATION & CURETTAGE/HYSTEROSCOPY WITH MYOSURE/ENDOMETRIAL POLYPECTOMY;  Surgeon: Brien Few, MD;  Location: [facility];  Service: Gynecology;  Laterality: N/A;  NON-ELECTIVE PER MD  . TUBAL LIGATION      History reviewed. No pertinent family history.  Social History:  reports that she has never smoked. She has never used smokeless tobacco. She reports previous alcohol use. She reports that she does not use drugs.  Allergies:  Allergies  Allergen Reactions  . Shellfish Allergy Anaphylaxis  . Erythromycin Nausea And Vomiting and Rash    Severe  . Sulfa Antibiotics Rash    Medications Prior to Admission  Medication Sig Dispense Refill Last Dose  . amoxicillin (AMOXIL) 500 MG tablet Take 500 mg by mouth 3 (three) times daily.   11/05/2018 at Unknown time  . ibuprofen (ADVIL,MOTRIN) 800 MG tablet Take 1 tablet (800 mg total) by mouth every 8 (eight) hours as needed for pain. 21 tablet 0 Past Month at Unknown time  . levothyroxine (SYNTHROID,  LEVOTHROID) 100 MCG tablet Take 100 mcg by mouth daily before breakfast.   11/06/2018 at 0600  . lisinopril-hydrochlorothiazide (PRINZIDE,ZESTORETIC) 20-25 MG per tablet Take 1 tablet by mouth daily.   11/05/2018 at Unknown time  . metFORMIN (GLUCOPHAGE) 500 MG tablet Take 500 mg by mouth 2 (two) times daily with a meal.    11/05/2018 at Unknown time  . saxagliptin HCl (ONGLYZA) 5 MG TABS tablet Take 5 mg by mouth daily.   11/05/2018 at Unknown time  . traMADol (ULTRAM) 50 MG tablet Take 1-2 tablets (50-100 mg total) by mouth every 6 (six) hours as needed for moderate pain. 30 tablet 0     Review of Systems  Constitutional: Negative.   All other systems reviewed and are negative.   Blood pressure 114/74, pulse 87, temperature 98.4 F (36.9 C), temperature source Oral, resp. rate 16, height 5' 5"  (1.651 m), weight 78.5 kg, last menstrual period 09/18/2018, SpO2 100 %. Physical Exam  Constitutional: She is oriented to person, place, and time. She appears well-developed and well-nourished.  HENT:  Head: Normocephalic and atraumatic.  Neck: Normal range of motion. Neck supple.  Cardiovascular: Normal rate and regular rhythm.  Respiratory: Effort normal and breath sounds normal.  GI: Soft. Bowel sounds are normal.  Genitourinary:    Vagina and uterus normal.   Musculoskeletal: Normal range of motion.  Neurological: She is alert and oriented to person, place, and time. She has normal reflexes.  Skin: Skin is warm and dry.  Psychiatric: She has a normal mood and affect.    Results for  orders placed or performed during the hospital encounter of 11/06/18 (from the past 24 hour(s))  ABO/Rh     Status: None   Collection Time: 11/06/18 11:21 AM  Result Value Ref Range   ABO/RH(D)      O POS Performed at [facility], [place] [address], [place], [place] [address]   Basic metabolic panel     Status: Abnormal   Collection Time: 11/06/18 11:26 AM  Result Value Ref Range   Sodium  136 135 - 145 mmol/L   Potassium 4.1 3.5 - 5.1 mmol/L   Chloride 104 98 - 111 mmol/L   CO2 24 22 - 32 mmol/L   Glucose, Bld 158 (H) 70 - 99 mg/dL   BUN 11 6 - 20 mg/dL   Creatinine, Ser 0.68 0.44 - 1.00 mg/dL   Calcium 9.0 8.9 - 10.3 mg/dL   GFR calc non Af Amer >60 >60 mL/min   GFR calc Af Amer >60 >60 mL/min   Anion gap 8 5 - 15  CBC     Status: Abnormal   Collection Time: 11/06/18 11:26 AM  Result Value Ref Range   WBC 6.9 4.0 - 10.5 K/uL   RBC 3.96 3.87 - 5.11 MIL/uL   Hemoglobin 9.5 (L) 12.0 - 15.0 g/dL   HCT 32.4 (L) 36.0 - 46.0 %   MCV 81.8 80.0 - 100.0 fL   MCH 24.0 (L) 26.0 - 34.0 pg   MCHC 29.3 (L) 30.0 - 36.0 g/dL   RDW 17.2 (H) 11.5 - 15.5 %   Platelets 314 150 - 400 K/uL   nRBC 0.0 0.0 - 0.2 %  Type and screen     Status: None   Collection Time: 11/06/18 11:26 AM  Result Value Ref Range   ABO/RH(D) O POS    Antibody Screen NEG    Sample Expiration      11/09/2018,2359 Performed at [facility], [place] [address] [place], [place] [address]     No results found.  Assessment/Plan: AUB with secondary anemia Diag HS, D&C with EAB Consent done.  Adelee Hannula J 11/06/2018, 1:10 PM

## 2018-11-06 NOTE — Op Note (Signed)
NAME: Dorothy Bowen, [place] MEDICAL RECORD PY:[id number] ACCOUNT [id number] DATE OF BIRTH:[birth year] FACILITY: [facility] LOCATION: [facility] PHYSICIAN:Airyana Sprunger J. Celene Pippins, MD  OPERATIVE REPORT  DATE OF PROCEDURE:  11/06/2018  PREOPERATIVE DIAGNOSIS:  Refractory dysfunctional uterine bleeding with secondary anemia.  POSTOPERATIVE DIAGNOSIS:  Refractory dysfunctional uterine bleeding with secondary anemia.   PROCEDURE:  Diagnostic hysteroscopy, dilatation and curettage, NovaSure endometrial ablation.  SURGEON:  Brien Few, MD  ASSISTANT:  None.  ESTIMATED BLOOD LOSS:  Less than 50 mL.  COMPLICATIONS:  None.  DRAINS:  None.  COUNTS:  Correct.  DISPOSITION:  The patient was taken to recovery in good condition.  BRIEF OPERATIVE NOTE:  After being apprised of the risks of anesthesia, infection, bleeding, injury to surrounding organs, possible need for repair, delayed versus immediate complications including bowel and bladder injury, possible need for repair,  inability to cure all bleeding, the patient was brought to the operating room and administered general anesthetic without complications.  Prepped and draped in the usual sterile fashion, catheterized until the bladder was empty.  Exam under anesthesia  revealed a bulky anteflexed uterus and no adnexal masses.  Dilute Marcaine solution placed, 20 mL total.  Standard paracervical block.  Cervix easily dilated up to a 21 Pratt dilator.  Hysteroscope placed.  Visualization revealed thickening along the  posterior wall consistent with recurrent endometrial polyp; otherwise, normal atrophic-appearing endometrial cavity.  A D and C and endometrial polypectomy were performed using direct visualization without difficulty.  Specimen collected including [facility]  sent to pathology for permanent section.  NovaSure device was placed, seated to a length of 6.5 and a width of 4.8.  CO2 test was negative, initiated for 53 seconds without difficulty.   The device was removed and inspected and found to be intact.   Reinspection of the endometrial cavity reveals a well-ablated cavity with a small area at the fundus, which is poorly ablated, but otherwise good anterior and posterior wall and cornual coverage.  At this time, no evidence of perforation was seen.  Fluid  deficit of 200 mL was noted.  The patient tolerated the procedure well, was awakened and transferred to recovery in good condition.  LN/NUANCE  D:11/06/2018 T:11/06/2018 JOB:[id number]

## 2018-11-06 NOTE — Anesthesia Preprocedure Evaluation (Addendum)
Anesthesia Evaluation  Patient identified by MRN, date of birth, ID band Patient awake    Reviewed: Allergy & Precautions, NPO status , Patient's Chart, lab work & pertinent test results  History of Anesthesia Complications Negative for: history of anesthetic complications  Airway Mallampati: II  TM Distance: >3 FB Neck ROM: Full    Dental  (+) Dental Advisory Given   Pulmonary neg pulmonary ROS,  11/06/2018 SARS coronavirus NEG   breath sounds clear to auscultation       Cardiovascular hypertension, Pt. on medications (-) angina Rhythm:Regular Rate:Normal     Neuro/Psych negative neurological ROS     GI/Hepatic negative GI ROS, Neg liver ROS,   Endo/Other  diabetes (glu 158), Oral Hypoglycemic AgentsHypothyroidism   Renal/GU negative Renal ROS     Musculoskeletal   Abdominal   Peds  Hematology  (+) anemia , Hb 9.5   Anesthesia Other Findings   Reproductive/Obstetrics S/p BTL                            Anesthesia Physical Anesthesia Plan  ASA: II  Anesthesia Plan: General   Post-op Pain Management:    Induction: Intravenous  PONV Risk Score and Plan: 3 and Ondansetron, Dexamethasone and Scopolamine patch - Pre-op  Airway Management Planned: LMA  Additional Equipment:   Intra-op Plan:   Post-operative Plan:   Informed Consent: I have reviewed the patients History and Physical, chart, labs and discussed the procedure including the risks, benefits and alternatives for the proposed anesthesia with the patient or authorized representative who has indicated his/her understanding and acceptance.     Dental advisory given  Plan Discussed with: CRNA and Surgeon  Anesthesia Plan Comments:        Anesthesia Quick Evaluation

## 2018-11-06 NOTE — Anesthesia Postprocedure Evaluation (Signed)
Anesthesia Post Note  Patient: Dorothy Bowen  Procedure(s) Performed: DILATATION & CURETTAGE/HYSTEROSCOPY WITH NOVASURE ABLATION (N/A Vagina )     Patient location during evaluation: PACU Anesthesia Type: General Level of consciousness: awake and alert, patient cooperative and oriented Pain management: pain level controlled Vital Signs Assessment: post-procedure vital signs reviewed and stable Respiratory status: spontaneous breathing, nonlabored ventilation and respiratory function stable Cardiovascular status: blood pressure returned to baseline and stable Postop Assessment: no apparent nausea or vomiting Anesthetic complications: no    Last Vitals:  Vitals:   11/06/18 1430 11/06/18 1445  BP: (!) 113/57 105/60  Pulse: 69 73  Resp: 17 16  Temp:    SpO2: 100% 93%    Last Pain:  Vitals:   11/06/18 1430  TempSrc:   PainSc: 0-No pain                 Dorothy Bowen,E. [place]

## 2018-11-10 ENCOUNTER — Encounter (HOSPITAL_BASED_OUTPATIENT_CLINIC_OR_DEPARTMENT_OTHER): Payer: Self-pay | Admitting: Obstetrics and Gynecology

## 2019-10-06 DIAGNOSIS — M797 Fibromyalgia: Secondary | ICD-10-CM | POA: Insufficient documentation

## 2019-10-06 DIAGNOSIS — E039 Hypothyroidism, unspecified: Secondary | ICD-10-CM | POA: Insufficient documentation

## 2019-10-06 DIAGNOSIS — E119 Type 2 diabetes mellitus without complications: Secondary | ICD-10-CM | POA: Insufficient documentation

## 2020-03-05 DIAGNOSIS — F32A Depression, unspecified: Secondary | ICD-10-CM | POA: Insufficient documentation

## 2020-03-19 LAB — COLOGUARD: COLOGUARD: NEGATIVE

## 2021-06-05 ENCOUNTER — Ambulatory Visit
Admission: RE | Admit: 2021-06-05 | Discharge: 2021-06-05 | Disposition: A | Discharge status: Home / Self Care | Payer: BC Managed Care – PPO | Source: Ambulatory Visit | Attending: Sports Medicine | Admitting: Sports Medicine

## 2021-06-05 ENCOUNTER — Other Ambulatory Visit: Payer: Self-pay | Admitting: Sports Medicine

## 2021-06-05 DIAGNOSIS — M533 Sacrococcygeal disorders, not elsewhere classified: Secondary | ICD-10-CM

## 2021-06-14 ENCOUNTER — Other Ambulatory Visit: Payer: Self-pay | Admitting: Sports Medicine

## 2021-06-14 DIAGNOSIS — M545 Low back pain, unspecified: Secondary | ICD-10-CM

## 2021-06-14 DIAGNOSIS — M533 Sacrococcygeal disorders, not elsewhere classified: Secondary | ICD-10-CM

## 2021-06-29 ENCOUNTER — Other Ambulatory Visit: Payer: Self-pay

## 2021-06-29 ENCOUNTER — Ambulatory Visit
Admission: RE | Admit: 2021-06-29 | Discharge: 2021-06-29 | Disposition: A | Discharge status: Home / Self Care | Payer: BC Managed Care – PPO | Source: Ambulatory Visit | Attending: Sports Medicine | Admitting: Sports Medicine

## 2021-06-29 DIAGNOSIS — M533 Sacrococcygeal disorders, not elsewhere classified: Secondary | ICD-10-CM

## 2021-06-29 DIAGNOSIS — M545 Low back pain, unspecified: Secondary | ICD-10-CM

## 2021-11-28 NOTE — Progress Notes (Unsigned)
[facility] [address] [place] [address] Phone: [phone] Subjective:   Dorothy Bowen, am serving as a scribe for Dr. Antoine Primas.   I'm seeing this patient by the request  of: [name]   CC: hip pain   [id number]  Dorothy Bowen is a 51 y.o. female coming in with complaint of coccyx pain. Patient has had pain for months which is getting worse. Has been doing PT through urology. Pain is constant worse with sitting. No injury to this area. Pain can radiate into the glute muscles at times. Using IBU prn.      Patient did have an MRI of the pelvis done in February 2023.  This was independently visualized by me showing no specific abnormality.  Mild enlargement of the uterus. Reviewing patient's imaging patient does have degenerative disc disease at L4-L5 and L5-S1.  Appears to have foraminal narrowing noted on the right side as well.  Past medical history significant for anemia in 2020    Past Medical History:  Diagnosis Date   Abnormal uterine bleeding (AUB)    Diabetes mellitus without complication ([facility])    Endometrial polyp    Hypertension    Iron deficiency anemia    MRSA infection    right hip from a spider bite, approx 13 years ago   Thyroid disease    Wears glasses    Past Surgical History:  Procedure Laterality Date   DILATATION & CURETTAGE/HYSTEROSCOPY WITH MYOSURE N/A 08/08/2018   Procedure: DILATATION & CURETTAGE/HYSTEROSCOPY WITH MYOSURE/ENDOMETRIAL POLYPECTOMY;  Surgeon: [name], MD;  Location: [facility];  Service: Gynecology;  Laterality: N/A;  NON-ELECTIVE PER MD   DILITATION & CURRETTAGE/HYSTROSCOPY WITH NOVASURE ABLATION N/A 11/06/2018   Procedure: DILATATION & CURETTAGE/HYSTEROSCOPY WITH NOVASURE ABLATION;  Surgeon: [name], MD;  Location: [facility];  Service: Gynecology;  Laterality: N/A;   TUBAL LIGATION     Social History   Socioeconomic  History   Marital status: Widowed    Spouse name: Not on file   Number of children: Not on file   Years of education: Not on file   Highest education level: Not on file  Occupational History   Not on file  Tobacco Use   Smoking status: Never   Smokeless tobacco: Never  Vaping Use   Vaping Use: Never used  Substance and Sexual Activity   Alcohol use: Not Currently   Drug use: No   Sexual activity: Never    Birth control/protection: Surgical  Other Topics Concern   Not on file  Social History Narrative   Not on file   Social Determinants of Health   Financial Resource Strain: Not on file  Food Insecurity: Not on file  Transportation Needs: Not on file  Physical Activity: Not on file  Stress: Not on file  Social Connections: Not on file   Allergies  Allergen Reactions   Shellfish Allergy Anaphylaxis   Erythromycin Nausea And Vomiting and Rash    Severe   Sulfa Antibiotics Rash   No family history on file.  Current Outpatient Medications (Endocrine & Metabolic):    levothyroxine (SYNTHROID) 88 MCG tablet, Take 88 mcg by mouth daily before breakfast.   metFORMIN (GLUCOPHAGE) 1000 MG tablet, Take 1,000 mg by mouth 2 (two) times daily with a meal.   Semaglutide,0.25 or 0.5MG /DOS, (OZEMPIC, 0.25 OR 0.5 MG/DOSE,) 2 MG/3ML SOPN, Inject into the skin.   sitaGLIPtin (JANUVIA) 100 MG tablet, Take 100 mg by  mouth daily.  Current Outpatient Medications (Cardiovascular):    lisinopril-hydrochlorothiazide (ZESTORETIC) 20-25 MG tablet, Take 1 tablet by mouth daily.   Current Outpatient Medications (Analgesics):    aspirin EC 325 MG tablet, Take 325 mg by mouth daily.   ibuprofen (ADVIL,MOTRIN) 800 MG tablet, Take 1 tablet (800 mg total) by mouth every 8 (eight) hours as needed for pain.   Current Outpatient Medications (Other):    DULoxetine (CYMBALTA) 20 MG capsule, Take 1 capsule (20 mg total) by mouth daily.   pregabalin (LYRICA) 150 MG capsule, Take 150 mg by mouth 2 (two)  times daily.    Reviewed prior external information including notes and imaging from  primary care provider As well as notes that were available from care everywhere and other healthcare systems.  Past medical history, social, surgical and family history all reviewed in electronic medical record.  No pertanent information unless stated regarding to the chief complaint.   Review of Systems:  No headache, visual changes, nausea, vomiting, diarrhea, constipation, dizziness, abdominal pain, skin rash, fevers, chills, night sweats, weight loss, swollen lymph nodes, body aches, joint swelling, chest pain, shortness of breath, mood changes. POSITIVE muscle aches  Objective  Blood pressure 110/78, pulse 82, height 5\' 5"  (1.651 m), weight 169 lb (76.7 kg), SpO2 96 %.   General: No apparent distress alert and oriented x3 mood and affect normal, dressed appropriately.  HEENT: Pupils equal, extraocular movements intact  Respiratory: Patient's speak in full sentences and does not appear short of breath  Cardiovascular: No lower extremity edema, non tender, no erythema  Low back exam does have some mild loss of lordosis.  Poor core strength.  Patient has diffuse tenderness in the muscles but nothing very specific.  Some mild pain over the coccyx bone itself though.  No masses appreciated.  Patient has weakness with 4 out of 5 strength of the hip abductors bilaterally.  Negative straight leg test but does have tightness of the hamstrings noted.  Patient does have some limited range of motion of the lumbar spine especially in extension of only 5 degrees    Impression and Recommendations:     The above documentation has been reviewed and is accurate and complete , DO

## 2021-11-29 ENCOUNTER — Ambulatory Visit: Payer: BC Managed Care – PPO | Admitting: Family Medicine

## 2021-11-29 ENCOUNTER — Encounter: Payer: Self-pay | Admitting: Family Medicine

## 2021-11-29 VITALS — BP 110/78 | HR 82 | Ht 65.0 in | Wt 169.0 lb

## 2021-11-29 DIAGNOSIS — M797 Fibromyalgia: Secondary | ICD-10-CM

## 2021-11-29 DIAGNOSIS — M2550 Pain in unspecified joint: Secondary | ICD-10-CM | POA: Diagnosis not present

## 2021-11-29 DIAGNOSIS — G629 Polyneuropathy, unspecified: Secondary | ICD-10-CM | POA: Insufficient documentation

## 2021-11-29 DIAGNOSIS — M533 Sacrococcygeal disorders, not elsewhere classified: Secondary | ICD-10-CM | POA: Diagnosis not present

## 2021-11-29 DIAGNOSIS — M549 Dorsalgia, unspecified: Secondary | ICD-10-CM | POA: Diagnosis not present

## 2021-11-29 DIAGNOSIS — I10 Essential (primary) hypertension: Secondary | ICD-10-CM | POA: Insufficient documentation

## 2021-11-29 DIAGNOSIS — D649 Anemia, unspecified: Secondary | ICD-10-CM | POA: Insufficient documentation

## 2021-11-29 LAB — VITAMIN D 25 HYDROXY (VIT D DEFICIENCY, FRACTURES): VITD: 24.79 ng/mL — ABNORMAL LOW (ref 30.00–100.00)

## 2021-11-29 LAB — FERRITIN: Ferritin: 7.5 ng/mL — ABNORMAL LOW (ref 10.0–291.0)

## 2021-11-29 LAB — TSH: TSH: 1.52 u[IU]/mL (ref 0.35–5.50)

## 2021-11-29 LAB — SEDIMENTATION RATE: Sed Rate: 27 mm/hr (ref 0–30)

## 2021-11-29 LAB — IBC PANEL
Iron: 71 ug/dL (ref 42–145)
Saturation Ratios: 13.1 % — ABNORMAL LOW (ref 20.0–50.0)
TIBC: 541.8 ug/dL — ABNORMAL HIGH (ref 250.0–450.0)
Transferrin: 387 mg/dL — ABNORMAL HIGH (ref 212.0–360.0)

## 2021-11-29 LAB — VITAMIN B12: Vitamin B-12: 133 pg/mL — ABNORMAL LOW (ref 211–911)

## 2021-11-29 LAB — URIC ACID: Uric Acid, Serum: 7.9 mg/dL — ABNORMAL HIGH (ref 2.4–7.0)

## 2021-11-29 MED ORDER — DULOXETINE HCL 20 MG PO CPEP: 20.0000 mg | ORAL_CAPSULE | Freq: Every day | ORAL | 0 refills | Status: DC

## 2021-11-29 NOTE — Assessment & Plan Note (Addendum)
Patient does have fibromyalgia that I think is contributing as well.  Has had some difficulty with multiple different things including the loss of her husband, as well as patient has failed all other conservative therapy at this time including physical therapy, pelvic floor therapy, and multiple medications including Lyrica.  We discussed the possibility of Cymbalta.  Patient did have difficulty with a different medication, sertraline she stated.  Patient knows of side effects and if any increase in suicidal or homicidal ideations to seek medical attention but I do think it may help with some of the pain.  Follow-up with me again in 4 to 6 weeks pain does seem to be out of the portion we will get some laboratory work-up to further evaluate as well.

## 2021-11-29 NOTE — Assessment & Plan Note (Signed)
Patient has pain that seems to be out of the ordinary and could be some of the fibromyalgia but also coccydynia.  We discussed that doing the nerve plexus and patient was interested in the Cymbalta.  In addition to this though I am concerned that cervical radiculopathy could be contributing as well.  Patient is having pain and does have x-rays that were independently visualized by me showing degenerative disc disease at L4-L5 and L5-S1.  Depending on the MRI patient could be a candidate for epidurals

## 2021-11-29 NOTE — Patient Instructions (Addendum)
Cymbalta 20mg  prescribed If any side affects let know and seek medical attention immediately  Labs today [facility] [phone] Call Today  When we receive your results we will contact you.  See you again in 5-6 weeks

## 2021-12-05 LAB — PTH, INTACT AND CALCIUM
Calcium: 9.7 mg/dL (ref 8.6–10.4)
PTH: 67 pg/mL (ref 16–77)

## 2021-12-05 LAB — ANTI-NUCLEAR AB-TITER (ANA TITER): ANA Titer 1: 1:80 {titer} — ABNORMAL HIGH

## 2021-12-05 LAB — ANA: Anti Nuclear Antibody (ANA): POSITIVE — AB

## 2021-12-11 ENCOUNTER — Other Ambulatory Visit: Payer: BC Managed Care – PPO

## 2021-12-20 ENCOUNTER — Other Ambulatory Visit: Payer: BC Managed Care – PPO

## 2021-12-20 ENCOUNTER — Other Ambulatory Visit: Payer: Self-pay | Admitting: Family Medicine

## 2021-12-30 ENCOUNTER — Ambulatory Visit
Admission: RE | Admit: 2021-12-30 | Discharge: 2021-12-30 | Disposition: A | Discharge status: Home / Self Care | Payer: BC Managed Care – PPO | Source: Ambulatory Visit | Attending: Family Medicine | Admitting: Family Medicine

## 2021-12-30 DIAGNOSIS — M549 Dorsalgia, unspecified: Secondary | ICD-10-CM

## 2022-01-09 NOTE — Progress Notes (Unsigned)
Dorothy Bowen [address] [place] [address] Phone: [phone] Subjective:   Dorothy Bowen, am serving as a scribe for Dr. Antoine Primas.   CC: cymbalta   [id number]  11/29/2021 Patient has pain that seems to be out of the ordinary and could be some of the fibromyalgia but also coccydynia.  We discussed that doing the nerve plexus and patient was interested in the Cymbalta.  In addition to this though I am concerned that cervical radiculopathy could be contributing as well.  Patient is having pain and does have x-rays that were independently visualized by me showing degenerative disc disease at L4-L5 and L5-S1.  Depending on the MRI patient could be a candidate for epidurals  Patient does have fibromyalgia that I think is contributing as well.  Has had some difficulty with multiple different things including the loss of her husband, as well as patient has failed all other conservative therapy at this time including physical therapy, pelvic floor therapy, and multiple medications including Lyrica.  We discussed the possibility of Cymbalta.  Patient did have difficulty with a different medication, sertraline she stated.  Patient knows of side effects and if any increase in suicidal or homicidal ideations to seek medical attention but I do think it may help with some of the pain.  Follow-up with me again in 4 to 6 weeks pain does seem to be out of the portion we will get some laboratory work-up to further evaluate as well.  Updated 01/10/2022 Dorothy Bowen is a 51 y.o. female coming in with complaint of polyarthralgia. Patient states that she still has pain in coccyx especially with sitting for prolonged periods. Cymbalta has helped pain in her feet but not in coccyx.  Patient may be making some mild improvement.  Laboratory work-up did show the patient did have difficulties with low vitamin D, iron,       Past Medical History:  Diagnosis Date    Abnormal uterine bleeding (AUB)    Diabetes mellitus without complication ([facility])    Endometrial polyp    Hypertension    Iron deficiency anemia    MRSA infection    right hip from a spider bite, approx 13 years ago   Thyroid disease    Wears glasses    Past Surgical History:  Procedure Laterality Date   DILATATION & CURETTAGE/HYSTEROSCOPY WITH MYOSURE N/A 08/08/2018   Procedure: DILATATION & CURETTAGE/HYSTEROSCOPY WITH MYOSURE/ENDOMETRIAL POLYPECTOMY;  Surgeon: [name], MD;  Location: [facility];  Service: Gynecology;  Laterality: N/A;  NON-ELECTIVE PER MD   DILITATION & CURRETTAGE/HYSTROSCOPY WITH NOVASURE ABLATION N/A 11/06/2018   Procedure: DILATATION & CURETTAGE/HYSTEROSCOPY WITH NOVASURE ABLATION;  Surgeon: [name], MD;  Location: [facility];  Service: Gynecology;  Laterality: N/A;   TUBAL LIGATION     Social History   Socioeconomic History   Marital status: Widowed    Spouse name: Not on file   Number of children: Not on file   Years of education: Not on file   Highest education level: Not on file  Occupational History   Not on file  Tobacco Use   Smoking status: Never   Smokeless tobacco: Never  Vaping Use   Vaping Use: Never used  Substance and Sexual Activity   Alcohol use: Not Currently   Drug use: No   Sexual activity: Never    Birth control/protection: Surgical  Other Topics Concern   Not on file  Social History Narrative  Not on file   Social Determinants of Health   Financial Resource Strain: Not on file  Food Insecurity: Not on file  Transportation Needs: Not on file  Physical Activity: Not on file  Stress: Not on file  Social Connections: Not on file   Allergies  Allergen Reactions   Shellfish Allergy Anaphylaxis   Erythromycin Nausea And Vomiting and Rash    Severe   Sulfa Antibiotics Rash   No family history on file.  Current Outpatient Medications (Endocrine & Metabolic):    levothyroxine  (SYNTHROID) 88 MCG tablet, Take 88 mcg by mouth daily before breakfast.   metFORMIN (GLUCOPHAGE) 1000 MG tablet, Take 1,000 mg by mouth 2 (two) times daily with a meal.   Semaglutide,0.25 or 0.5MG /DOS, (OZEMPIC, 0.25 OR 0.5 MG/DOSE,) 2 MG/3ML SOPN, Inject into the skin.   sitaGLIPtin (JANUVIA) 100 MG tablet, Take 100 mg by mouth daily.  Current Outpatient Medications (Cardiovascular):    lisinopril-hydrochlorothiazide (ZESTORETIC) 20-25 MG tablet, Take 1 tablet by mouth daily.   Current Outpatient Medications (Analgesics):    allopurinol (ZYLOPRIM) 100 MG tablet, Take 2 tablets (200 mg total) by mouth daily.   aspirin EC 325 MG tablet, Take 325 mg by mouth daily.   ibuprofen (ADVIL,MOTRIN) 800 MG tablet, Take 1 tablet (800 mg total) by mouth every 8 (eight) hours as needed for pain.   Current Outpatient Medications (Other):    DULoxetine (CYMBALTA) 20 MG capsule, Take 1 capsule by mouth once daily   pregabalin (LYRICA) 150 MG capsule, Take 150 mg by mouth 2 (two) times daily.   Reviewed prior external information including notes and imaging from  primary care provider As well as notes that were available from care everywhere and other healthcare systems.  Past medical history, social, surgical and family history all reviewed in electronic medical record.  No pertanent information unless stated regarding to the chief complaint.   Review of Systems:  No headache, visual changes, nausea, vomiting, diarrhea, constipation, dizziness, abdominal pain, skin rash, fevers, chills, night sweats, weight loss, swollen lymph nodes, body aches, joint swelling, chest pain, shortness of breath, mood changes. POSITIVE muscle aches  Objective  Blood pressure 106/82, pulse 84, height 5\' 5"  (1.651 m), weight 163 lb (73.9 kg), SpO2 95 %.   General: No apparent distress alert and oriented x3 mood and affect normal, dressed appropriately.  HEENT: Pupils equal, extraocular movements intact  Respiratory:  Patient's speak in full sentences and does not appear short of breath  Cardiovascular: No lower extremity edema, non tender, no erythema  Patient is sitting relatively comfortably.    Impression and Recommendations:    The above documentation has been reviewed and is accurate and complete , DO

## 2022-01-10 ENCOUNTER — Encounter: Payer: Self-pay | Admitting: Family Medicine

## 2022-01-10 ENCOUNTER — Ambulatory Visit (INDEPENDENT_AMBULATORY_CARE_PROVIDER_SITE_OTHER): Payer: BC Managed Care – PPO | Admitting: Family Medicine

## 2022-01-10 VITALS — BP 106/82 | HR 84 | Ht 65.0 in | Wt 163.0 lb

## 2022-01-10 DIAGNOSIS — G629 Polyneuropathy, unspecified: Secondary | ICD-10-CM

## 2022-01-10 DIAGNOSIS — M797 Fibromyalgia: Secondary | ICD-10-CM

## 2022-01-10 DIAGNOSIS — E538 Deficiency of other specified B group vitamins: Secondary | ICD-10-CM | POA: Diagnosis not present

## 2022-01-10 DIAGNOSIS — E790 Hyperuricemia without signs of inflammatory arthritis and tophaceous disease: Secondary | ICD-10-CM | POA: Diagnosis not present

## 2022-01-10 MED ORDER — DULOXETINE HCL 30 MG PO CPEP: 30.0000 mg | ORAL_CAPSULE | Freq: Every day | ORAL | 0 refills | Status: DC

## 2022-01-10 MED ORDER — CYANOCOBALAMIN 1000 MCG/ML IJ SOLN
1000.0000 ug | Freq: Once | INTRAMUSCULAR | Status: AC
  Administered 2022-01-10: 1000 ug via INTRAMUSCULAR

## 2022-01-10 MED ORDER — ALLOPURINOL 100 MG PO TABS: 200.0000 mg | ORAL_TABLET | Freq: Every day | ORAL | 6 refills | Status: DC

## 2022-01-10 NOTE — Assessment & Plan Note (Signed)
Patient does have fibromyalgia.  Has made some improvement with the Cymbalta at 20 mg and will increase to 30 mg.

## 2022-01-10 NOTE — Patient Instructions (Signed)
Take allopurinol 200mg  daily  Tart cherry extract 1200mg  at night (nature's made or NOW) Vega sport protein  Ferrous gluconate 65 mg with 500mg  of vitamin C would be good.  B12 injection today and then take the supplement  See me again in 8 weeks and we will recheck labs and see how you are feeling

## 2022-01-10 NOTE — Assessment & Plan Note (Signed)
Could be caused by the low B12 or vitamin D.  Has shown already improvement with the duloxetine.  Patient is titrating down off of the Lyrica.

## 2022-01-10 NOTE — Assessment & Plan Note (Signed)
Injection given today and will continue with supplementation

## 2022-01-10 NOTE — Assessment & Plan Note (Signed)
Started on low-dose allopurinol and hopefully will be beneficial.

## 2022-03-02 NOTE — Progress Notes (Unsigned)
[facility] [address] [place] Phone: [phone] Subjective:   IVilma Bowen, am serving as a scribe for Dr. Hulan Saas.  I'm seeing this patient by the request  of:  Patient, No Pcp Per  CC: Multiple joint pain and muscle.  [id number]  01/10/2022 Could be caused by the low B12 or vitamin D.  Has shown already improvement with the duloxetine.  Patient is titrating down off of the Lyrica.  Injection given today and will continue with supplementation  Started on low-dose allopurinol and hopefully will be beneficial.  Patient does have fibromyalgia.  Has made some improvement with the Cymbalta at 20 mg and will increase to 30 mg.  Updated 03/07/2022 Dorothy Bowen is a 51 y.o. female coming in with complaint of fibromyalgia. Here to give you updates and possible do some labs. No other issues.  States slowly over the course of last several months       Past Medical History:  Diagnosis Date   Abnormal uterine bleeding (AUB)    Diabetes mellitus without complication (HCC)    Endometrial polyp    Hypertension    Iron deficiency anemia    MRSA infection    right hip from a spider bite, approx 13 years ago   Thyroid disease    Wears glasses    Past Surgical History:  Procedure Laterality Date   DILATATION & CURETTAGE/HYSTEROSCOPY WITH MYOSURE N/A 08/08/2018   Procedure: DILATATION & CURETTAGE/HYSTEROSCOPY WITH MYOSURE/ENDOMETRIAL POLYPECTOMY;  Surgeon: Brien Few, MD;  Location: [facility];  Service: Gynecology;  Laterality: N/A;  NON-ELECTIVE PER MD   DILITATION & CURRETTAGE/HYSTROSCOPY WITH NOVASURE ABLATION N/A 11/06/2018   Procedure: DILATATION & CURETTAGE/HYSTEROSCOPY WITH NOVASURE ABLATION;  Surgeon: Brien Few, MD;  Location: [facility];  Service: Gynecology;  Laterality: N/A;   TUBAL LIGATION     Social History   Socioeconomic History   Marital status: Widowed     Spouse name: Not on file   Number of children: Not on file   Years of education: Not on file   Highest education level: Not on file  Occupational History   Not on file  Tobacco Use   Smoking status: Never   Smokeless tobacco: Never  Vaping Use   Vaping Use: Never used  Substance and Sexual Activity   Alcohol use: Not Currently   Drug use: No   Sexual activity: Never    Birth control/protection: Surgical  Other Topics Concern   Not on file  Social History Narrative   Not on file   Social Determinants of Health   Financial Resource Strain: Not on file  Food Insecurity: Not on file  Transportation Needs: Not on file  Physical Activity: Not on file  Stress: Not on file  Social Connections: Not on file   Allergies  Allergen Reactions   Shellfish Allergy Anaphylaxis   Erythromycin Nausea And Vomiting and Rash    Severe   Sulfa Antibiotics Rash   No family history on file.  Current Outpatient Medications (Endocrine & Metabolic):    levothyroxine (SYNTHROID) 88 MCG tablet, Take 88 mcg by mouth daily before breakfast.   metFORMIN (GLUCOPHAGE) 1000 MG tablet, Take 1,000 mg by mouth 2 (two) times daily with a meal.   Semaglutide,0.25 or 0.5MG /DOS, (OZEMPIC, 0.25 OR 0.5 MG/DOSE,) 2 MG/3ML SOPN, Inject into the skin.   sitaGLIPtin (JANUVIA) 100 MG tablet, Take 100 mg by mouth daily.  Current Outpatient Medications (Cardiovascular):  lisinopril-hydrochlorothiazide (ZESTORETIC) 20-25 MG tablet, Take 1 tablet by mouth daily.   Current Outpatient Medications (Analgesics):    allopurinol (ZYLOPRIM) 100 MG tablet, Take 2 tablets (200 mg total) by mouth daily.   aspirin EC 325 MG tablet, Take 325 mg by mouth daily.   ibuprofen (ADVIL,MOTRIN) 800 MG tablet, Take 1 tablet (800 mg total) by mouth every 8 (eight) hours as needed for pain.   Current Outpatient Medications (Other):    DULoxetine (CYMBALTA) 20 MG capsule, Take 2 capsules (40 mg total) by mouth daily.   pregabalin  (LYRICA) 150 MG capsule, Take 150 mg by mouth 2 (two) times daily.   Reviewed prior external information including notes and imaging from  primary care provider As well as notes that were available from care everywhere and other healthcare systems.  Past medical history, social, surgical and family history all reviewed in electronic medical record.  No pertanent information unless stated regarding to the chief complaint.   Review of Systems:  No headache, visual changes, nausea, vomiting, diarrhea, constipation, dizziness, abdominal pain, skin rash, fevers, chills, night sweats, weight loss, swollen lymph nodes, , joint swelling, chest pain, shortness of breath, mood changes. POSITIVE muscle aches, body aches  Objective  Blood pressure 116/74, pulse 90, height 5\' 5"  (1.651 m), weight 165 lb (74.8 kg), SpO2 97 %.   General: No apparent distress alert and oriented x3 mood and affect normal, dressed appropriately.  HEENT: Pupils equal, extraocular movements intact  Respiratory: Patient's speak in full sentences and does not appear short of breath  Cardiovascular: No lower extremity edema, non tender, no erythema  Patient still sore.  Does sit and has some mild decrease in range of motion of the neck.     Impression and Recommendations:     The above documentation has been reviewed and is accurate and complete , DO

## 2022-03-07 ENCOUNTER — Ambulatory Visit (INDEPENDENT_AMBULATORY_CARE_PROVIDER_SITE_OTHER): Payer: BC Managed Care – PPO | Admitting: Family Medicine

## 2022-03-07 VITALS — BP 116/74 | HR 90 | Ht 65.0 in | Wt 165.0 lb

## 2022-03-07 DIAGNOSIS — M797 Fibromyalgia: Secondary | ICD-10-CM | POA: Diagnosis not present

## 2022-03-07 DIAGNOSIS — E790 Hyperuricemia without signs of inflammatory arthritis and tophaceous disease: Secondary | ICD-10-CM

## 2022-03-07 DIAGNOSIS — D509 Iron deficiency anemia, unspecified: Secondary | ICD-10-CM | POA: Diagnosis not present

## 2022-03-07 DIAGNOSIS — E538 Deficiency of other specified B group vitamins: Secondary | ICD-10-CM

## 2022-03-07 DIAGNOSIS — M2550 Pain in unspecified joint: Secondary | ICD-10-CM | POA: Diagnosis not present

## 2022-03-07 LAB — FERRITIN: Ferritin: 6.3 ng/mL — ABNORMAL LOW (ref 10.0–291.0)

## 2022-03-07 LAB — URIC ACID: Uric Acid, Serum: 4.1 mg/dL (ref 2.4–7.0)

## 2022-03-07 LAB — IBC PANEL
Iron: 34 ug/dL — ABNORMAL LOW (ref 42–145)
Saturation Ratios: 7.8 % — ABNORMAL LOW (ref 20.0–50.0)
TIBC: 438.2 ug/dL (ref 250.0–450.0)
Transferrin: 313 mg/dL (ref 212.0–360.0)

## 2022-03-07 LAB — VITAMIN B12: Vitamin B-12: 514 pg/mL (ref 211–911)

## 2022-03-07 LAB — VITAMIN D 25 HYDROXY (VIT D DEFICIENCY, FRACTURES): VITD: 29.4 ng/mL — ABNORMAL LOW (ref 30.00–100.00)

## 2022-03-07 MED ORDER — DULOXETINE HCL 20 MG PO CPEP: 40.0000 mg | ORAL_CAPSULE | Freq: Every day | ORAL | 3 refills | Status: DC

## 2022-03-07 NOTE — Patient Instructions (Addendum)
Cymbalta 40 mg Probiotic 10 strains 10 billion units Labs today See me again in 2 months

## 2022-03-08 NOTE — Assessment & Plan Note (Signed)
Recheck iron levels and CBC. °

## 2022-03-08 NOTE — Assessment & Plan Note (Signed)
Recheck B12 level 

## 2022-03-08 NOTE — Assessment & Plan Note (Signed)
Patient still has signs and symptoms consistent with some type of chronic pain syndrome including the fibromyalgia.  We will continue to increase the duloxetine with patient seeming to improve very slowly.  We will go up to 40 mg.  I do think it is worthwhile to repeat labs with the patient was found to have significant anemia, B12 deficiency, as well as elevated uric acid.  Patient is on a very low-dose of allopurinol and will make sure she is at goal.  Patient is attempting more weight loss and I do think that will be significantly beneficial for most of her chronic pain symptoms.  Follow-up with me again in 7 to 8 weeks.  Total time discussing with patient and reviewing patient's charts and labs 31 minutes

## 2022-03-08 NOTE — Assessment & Plan Note (Signed)
Recheck uric acid to make sure we are at goal of less than 6

## 2022-05-02 NOTE — Progress Notes (Signed)
[name] [address] [place] [address] Phone: [phone] Subjective:    I'm seeing this patient by the request  of:  Patient, No Pcp Per  CC: Multiple complaint follow-up  [id number]  03/07/2022 Recheck B12 level     Recheck uric acid to make sure we are at goal of less than 6   Recheck iron levels and CBC   Patient still has signs and symptoms consistent with some type of chronic pain syndrome including the fibromyalgia.  We will continue to increase the duloxetine with patient seeming to improve very slowly.  We will go up to 40 mg.  I do think it is worthwhile to repeat labs with the patient was found to have significant anemia, B12 deficiency, as well as elevated uric acid.  Patient is on a very low-dose of allopurinol and will make sure she is at goal.  Patient is attempting more weight loss and I do think that will be significantly beneficial for most of her chronic pain symptoms.  Follow-up with me again in 7 to 8 weeks.  Total time discussing with patient and reviewing patient's charts and labs 31 minutes     Update 05/09/2022 Dorothy Bowen is a 51 y.o. female coming in with complaint of polyarthralgia. Patient states that she's not to bad , still has the pain that she had since the first time she saw up but isnt to bad Patient was seen previously and most recent labs did have uric acid back to normal but unfortunately had worsening iron anemia and vitamin D did not make any significant improvement.     Past Medical History:  Diagnosis Date   Abnormal uterine bleeding (AUB)    Diabetes mellitus without complication (HCC)    Endometrial polyp    Hypertension    Iron deficiency anemia    MRSA infection    right hip from a spider bite, approx 13 years ago   Thyroid disease    Wears glasses    Past Surgical History:  Procedure Laterality Date   DILATATION & CURETTAGE/HYSTEROSCOPY WITH MYOSURE N/A 08/08/2018    Procedure: DILATATION & CURETTAGE/HYSTEROSCOPY WITH MYOSURE/ENDOMETRIAL POLYPECTOMY;  Surgeon: [name], MD;  Location: [facility];  Service: Gynecology;  Laterality: N/A;  NON-ELECTIVE PER MD   DILITATION & CURRETTAGE/HYSTROSCOPY WITH NOVASURE ABLATION N/A 11/06/2018   Procedure: DILATATION & CURETTAGE/HYSTEROSCOPY WITH NOVASURE ABLATION;  Surgeon: [name], MD;  Location: [facility] [place];  Service: Gynecology;  Laterality: N/A;   TUBAL LIGATION     Social History   Socioeconomic History   Marital status: Widowed    Spouse name: Not on file   Number of children: Not on file   Years of education: Not on file   Highest education level: Not on file  Occupational History   Not on file  Tobacco Use   Smoking status: Never   Smokeless tobacco: Never  Vaping Use   Vaping Use: Never used  Substance and Sexual Activity   Alcohol use: Not Currently   Drug use: No   Sexual activity: Never    Birth control/protection: Surgical  Other Topics Concern   Not on file  Social History Narrative   Not on file   Social Determinants of Health   Financial Resource Strain: Not on file  Food Insecurity: Not on file  Transportation Needs: Not on file  Physical Activity: Not on file  Stress: Not on file  Social Connections: Not on file  Allergies  Allergen Reactions   Shellfish Allergy Anaphylaxis   Erythromycin Nausea And Vomiting and Rash    Severe   Sulfa Antibiotics Rash   No family history on file.  Current Outpatient Medications (Endocrine & Metabolic):    levothyroxine (SYNTHROID) 88 MCG tablet, Take 88 mcg by mouth daily before breakfast.   metFORMIN (GLUCOPHAGE) 1000 MG tablet, Take 1,000 mg by mouth 2 (two) times daily with a meal.   Semaglutide,0.25 or 0.5MG /DOS, (OZEMPIC, 0.25 OR 0.5 MG/DOSE,) 2 MG/3ML SOPN, Inject into the skin.   sitaGLIPtin (JANUVIA) 100 MG tablet, Take 100 mg by mouth daily.  Current Outpatient Medications  (Cardiovascular):    lisinopril-hydrochlorothiazide (ZESTORETIC) 20-25 MG tablet, Take 1 tablet by mouth daily.   Current Outpatient Medications (Analgesics):    allopurinol (ZYLOPRIM) 100 MG tablet, Take 2 tablets (200 mg total) by mouth daily.   aspirin EC 325 MG tablet, Take 325 mg by mouth daily.   ibuprofen (ADVIL,MOTRIN) 800 MG tablet, Take 1 tablet (800 mg total) by mouth every 8 (eight) hours as needed for pain.   Current Outpatient Medications (Other):    DULoxetine (CYMBALTA) 20 MG capsule, Take 2 capsules (40 mg total) by mouth daily.   pregabalin (LYRICA) 150 MG capsule, Take 150 mg by mouth 2 (two) times daily.   Vitamin D, Ergocalciferol, (DRISDOL) 1.25 MG (50000 UNIT) CAPS capsule, Take 1 capsule (50,000 Units total) by mouth every 7 (seven) days.   Reviewed prior external information including notes and imaging from  primary care provider As well as notes that were available from care everywhere and other healthcare systems.  Past medical history, social, surgical and family history all reviewed in electronic medical record.  No pertanent information unless stated regarding to the chief complaint.   Review of Systems:  No headache, visual changes, nausea, vomiting, diarrhea, constipation, dizziness, abdominal pain, skin rash, fevers, chills, night sweats, weight loss, swollen lymph nodes, , joint swelling, chest pain, shortness of breath, mood changes. POSITIVE muscle aches, body aches, difficulty with sleep  Objective  Blood pressure 110/80, pulse 96, height 5\' 5"  (1.651 m), weight 163 lb (73.9 kg), SpO2 98 %.   General: No apparent distress alert and oriented x3 mood and affect normal, dressed appropriately.  HEENT: Pupils equal, extraocular movements intact  Respiratory: Patient's speak in full sentences and does not appear short of breath  Cardiovascular: No lower extremity edema, non tender, no erythema  Patient back is tender to palpation in the paraspinal  musculature right greater than left.  More pain over the right sacroiliac joint.   Osteopathic findings  T11 extended rotated and side bent left L1 flexed rotated and side bent right Sacrum right on right    Impression and Recommendations:     The above documentation has been reviewed and is accurate and complete , DO

## 2022-05-09 ENCOUNTER — Ambulatory Visit (INDEPENDENT_AMBULATORY_CARE_PROVIDER_SITE_OTHER): Payer: BC Managed Care – PPO | Admitting: Family Medicine

## 2022-05-09 ENCOUNTER — Encounter: Payer: Self-pay | Admitting: Family Medicine

## 2022-05-09 VITALS — BP 110/80 | HR 96 | Ht 65.0 in | Wt 163.0 lb

## 2022-05-09 DIAGNOSIS — E790 Hyperuricemia without signs of inflammatory arthritis and tophaceous disease: Secondary | ICD-10-CM | POA: Diagnosis not present

## 2022-05-09 DIAGNOSIS — M2550 Pain in unspecified joint: Secondary | ICD-10-CM

## 2022-05-09 DIAGNOSIS — M9904 Segmental and somatic dysfunction of sacral region: Secondary | ICD-10-CM | POA: Diagnosis not present

## 2022-05-09 DIAGNOSIS — M9902 Segmental and somatic dysfunction of thoracic region: Secondary | ICD-10-CM | POA: Diagnosis not present

## 2022-05-09 DIAGNOSIS — M9903 Segmental and somatic dysfunction of lumbar region: Secondary | ICD-10-CM | POA: Diagnosis not present

## 2022-05-09 DIAGNOSIS — R7989 Other specified abnormal findings of blood chemistry: Secondary | ICD-10-CM | POA: Diagnosis not present

## 2022-05-09 DIAGNOSIS — M797 Fibromyalgia: Secondary | ICD-10-CM | POA: Diagnosis not present

## 2022-05-09 DIAGNOSIS — D509 Iron deficiency anemia, unspecified: Secondary | ICD-10-CM | POA: Diagnosis not present

## 2022-05-09 LAB — FERRITIN: Ferritin: 15 ng/mL (ref 10.0–291.0)

## 2022-05-09 LAB — IBC PANEL
Iron: 30 ug/dL — ABNORMAL LOW (ref 42–145)
Saturation Ratios: 7.7 % — ABNORMAL LOW (ref 20.0–50.0)
TIBC: 390.6 ug/dL (ref 250.0–450.0)
Transferrin: 279 mg/dL (ref 212.0–360.0)

## 2022-05-09 LAB — VITAMIN D 25 HYDROXY (VIT D DEFICIENCY, FRACTURES): VITD: 35.09 ng/mL (ref 30.00–100.00)

## 2022-05-09 MED ORDER — VITAMIN D (ERGOCALCIFEROL) 1.25 MG (50000 UNIT) PO CAPS: 50000.0000 [IU] | ORAL_CAPSULE | ORAL | 0 refills | Status: DC

## 2022-05-09 NOTE — Assessment & Plan Note (Signed)

## 2022-05-09 NOTE — Assessment & Plan Note (Signed)
Significantly more stable with the allopurinol.  Has helped some of the pain but not all of it yet.

## 2022-05-09 NOTE — Assessment & Plan Note (Signed)
Once weekly vitamin D prescribed today.  Discussed icing regimen and home exercises.  Discussed which activities to do and which ones to avoid.  Increase activity slowly.  Follow-up again in 2 to 3 months will start once weekly vitamin D given the significant improvement

## 2022-05-09 NOTE — Patient Instructions (Addendum)
Once weekly vitamin D  Check ferritin iron panel vitamin D  If iron is low we will do a transfusion  2-3 months

## 2022-05-09 NOTE — Assessment & Plan Note (Addendum)
Continues to have some chronic pain.  Discussed the possibility of osteopathic manipulation.  Discussed posture and ergonomics.  Did respond well to some of this.  Patient will hold on any other medication changes at this point but will check labs again as stated.  Total time with patient 33 minutes this does not include the osteopathic manipulation.  Continue the medications including the Cymbalta at 40 mg daily

## 2022-05-09 NOTE — Assessment & Plan Note (Signed)
Has continued to have significant anemia.  Continues to have difficulty with sleep at the moment.  Has tried multiple medications and would like to get to the bottom of it.  Do feel that the iron could be contributing and will get an iron panel again.  If patient does not have any improvement in ferritin we will need to consider transfusion.  Follow-up with me again in 2 to 3 months otherwise.

## 2022-06-11 ENCOUNTER — Telehealth: Payer: Self-pay | Admitting: Pharmacy Technician

## 2022-06-11 ENCOUNTER — Other Ambulatory Visit: Payer: Self-pay | Admitting: *Deleted

## 2022-06-11 DIAGNOSIS — R790 Abnormal level of blood mineral: Secondary | ICD-10-CM

## 2022-06-11 NOTE — Telephone Encounter (Signed)
Dr. Tamala Julian,  Shirlean Kelly is non preferred medication with [employer] and will be denied if patient has not failed or tried venofer. Preferred medication is venofer. Would you like to try venofer?

## 2022-06-12 ENCOUNTER — Other Ambulatory Visit: Payer: Self-pay | Admitting: Pharmacy Technician

## 2022-06-12 DIAGNOSIS — D500 Iron deficiency anemia secondary to blood loss (chronic): Secondary | ICD-10-CM | POA: Insufficient documentation

## 2022-06-13 ENCOUNTER — Ambulatory Visit (INDEPENDENT_AMBULATORY_CARE_PROVIDER_SITE_OTHER): Payer: BC Managed Care – PPO

## 2022-06-13 VITALS — BP 113/75 | HR 83 | Temp 98.1°F | Resp 18 | Ht 65.0 in | Wt 160.8 lb

## 2022-06-13 DIAGNOSIS — R790 Abnormal level of blood mineral: Secondary | ICD-10-CM

## 2022-06-13 DIAGNOSIS — D500 Iron deficiency anemia secondary to blood loss (chronic): Secondary | ICD-10-CM | POA: Diagnosis not present

## 2022-06-13 MED ORDER — DIPHENHYDRAMINE HCL 25 MG PO CAPS
25.0000 mg | ORAL_CAPSULE | Freq: Once | ORAL | Status: AC
  Administered 2022-06-13: 25 mg via ORAL
  Filled 2022-06-13: qty 1

## 2022-06-13 MED ORDER — SODIUM CHLORIDE 0.9 % IV SOLN
200.0000 mg | Freq: Once | INTRAVENOUS | Status: AC
  Administered 2022-06-13: 200 mg via INTRAVENOUS
  Filled 2022-06-13: qty 10

## 2022-06-13 MED ORDER — ACETAMINOPHEN 325 MG PO TABS
650.0000 mg | ORAL_TABLET | Freq: Once | ORAL | Status: AC
  Administered 2022-06-13: 650 mg via ORAL
  Filled 2022-06-13: qty 2

## 2022-06-13 NOTE — Progress Notes (Signed)
Diagnosis: Iron Deficiency Anemia  Provider:  Marshell Garfinkel MD  Procedure: Infusion  IV Type: Peripheral, IV Location: L Antecubital  Venofer (Iron Sucrose), Dose: 200 mg  Infusion Start Time: 1206  Infusion Stop Time: 1223  Post Infusion IV Care: Observation period completed and Peripheral IV Discontinued  Discharge: Condition: Good, Destination: Home . AVS provided to patient.   Performed by:  Arnoldo Morale, RN

## 2022-06-15 ENCOUNTER — Ambulatory Visit (INDEPENDENT_AMBULATORY_CARE_PROVIDER_SITE_OTHER): Payer: BC Managed Care – PPO | Admitting: *Deleted

## 2022-06-15 VITALS — BP 119/79 | HR 79 | Temp 97.8°F | Resp 16 | Ht 65.0 in | Wt 160.6 lb

## 2022-06-15 DIAGNOSIS — R790 Abnormal level of blood mineral: Secondary | ICD-10-CM

## 2022-06-15 DIAGNOSIS — D500 Iron deficiency anemia secondary to blood loss (chronic): Secondary | ICD-10-CM

## 2022-06-15 MED ORDER — SODIUM CHLORIDE 0.9 % IV SOLN
200.0000 mg | Freq: Once | INTRAVENOUS | Status: AC
  Administered 2022-06-15: 200 mg via INTRAVENOUS
  Filled 2022-06-15: qty 10

## 2022-06-15 MED ORDER — ACETAMINOPHEN 325 MG PO TABS
650.0000 mg | ORAL_TABLET | Freq: Once | ORAL | Status: AC
  Administered 2022-06-15: 650 mg via ORAL
  Filled 2022-06-15: qty 2

## 2022-06-15 MED ORDER — DIPHENHYDRAMINE HCL 25 MG PO CAPS
25.0000 mg | ORAL_CAPSULE | Freq: Once | ORAL | Status: AC
  Administered 2022-06-15: 25 mg via ORAL
  Filled 2022-06-15: qty 1

## 2022-06-15 NOTE — Progress Notes (Signed)
Diagnosis: Iron Deficiency Anemia  Provider:  Marshell Garfinkel MD  Procedure: Infusion  IV Type: Peripheral, IV Location: L Antecubital  Venofer (Iron Sucrose), Dose: 200 mg  Infusion Start Time: 9371  Infusion Stop Time: 6967  Post Infusion IV Care: Peripheral IV Discontinued  Discharge: Condition: Good, Destination: Home . AVS provided to patient.   Performed by:  Adelina Mings, LPN

## 2022-06-18 ENCOUNTER — Ambulatory Visit (INDEPENDENT_AMBULATORY_CARE_PROVIDER_SITE_OTHER): Payer: BC Managed Care – PPO

## 2022-06-18 VITALS — BP 115/75 | HR 84 | Temp 98.0°F | Resp 18 | Ht 65.0 in | Wt 162.4 lb

## 2022-06-18 DIAGNOSIS — R790 Abnormal level of blood mineral: Secondary | ICD-10-CM

## 2022-06-18 DIAGNOSIS — D500 Iron deficiency anemia secondary to blood loss (chronic): Secondary | ICD-10-CM | POA: Diagnosis not present

## 2022-06-18 MED ORDER — SODIUM CHLORIDE 0.9 % IV SOLN
200.0000 mg | Freq: Once | INTRAVENOUS | Status: AC
  Administered 2022-06-18: 200 mg via INTRAVENOUS
  Filled 2022-06-18: qty 10

## 2022-06-18 MED ORDER — DIPHENHYDRAMINE HCL 25 MG PO CAPS
25.0000 mg | ORAL_CAPSULE | Freq: Once | ORAL | Status: AC
  Administered 2022-06-18: 25 mg via ORAL
  Filled 2022-06-18: qty 1

## 2022-06-18 MED ORDER — ACETAMINOPHEN 325 MG PO TABS
650.0000 mg | ORAL_TABLET | Freq: Once | ORAL | Status: AC
  Administered 2022-06-18: 650 mg via ORAL
  Filled 2022-06-18: qty 2

## 2022-06-18 NOTE — Progress Notes (Signed)
Diagnosis: Iron Deficiency Anemia  Provider:  Marshell Garfinkel MD  Procedure: Infusion  IV Type: Peripheral, IV Location: L Forearm  Venofer (Iron Sucrose), Dose: 200 mg  Infusion Start Time: 4081  Infusion Stop Time: 4481  Post Infusion IV Care: Peripheral IV Discontinued  Discharge: Condition: Good, Destination: Home . AVS provided to patient.   Performed by:  Arnoldo Morale, RN

## 2022-06-20 ENCOUNTER — Ambulatory Visit (INDEPENDENT_AMBULATORY_CARE_PROVIDER_SITE_OTHER): Payer: BC Managed Care – PPO

## 2022-06-20 VITALS — BP 104/69 | HR 76 | Temp 98.1°F | Resp 18 | Ht 65.0 in | Wt 162.6 lb

## 2022-06-20 DIAGNOSIS — R790 Abnormal level of blood mineral: Secondary | ICD-10-CM | POA: Diagnosis not present

## 2022-06-20 DIAGNOSIS — D500 Iron deficiency anemia secondary to blood loss (chronic): Secondary | ICD-10-CM | POA: Diagnosis not present

## 2022-06-20 MED ORDER — SODIUM CHLORIDE 0.9 % IV SOLN
200.0000 mg | Freq: Once | INTRAVENOUS | Status: AC
  Administered 2022-06-20: 200 mg via INTRAVENOUS
  Filled 2022-06-20: qty 10

## 2022-06-20 MED ORDER — DIPHENHYDRAMINE HCL 25 MG PO CAPS
25.0000 mg | ORAL_CAPSULE | Freq: Once | ORAL | Status: AC
  Administered 2022-06-20: 25 mg via ORAL
  Filled 2022-06-20: qty 1

## 2022-06-20 MED ORDER — ACETAMINOPHEN 325 MG PO TABS
650.0000 mg | ORAL_TABLET | Freq: Once | ORAL | Status: AC
  Administered 2022-06-20: 650 mg via ORAL
  Filled 2022-06-20: qty 2

## 2022-06-20 NOTE — Progress Notes (Signed)
Diagnosis: Iron Deficiency Anemia  Provider:  Marshell Garfinkel MD  Procedure: Infusion  IV Type: Peripheral, IV Location: R Forearm  Venofer (Iron Sucrose), Dose: 200 mg  Infusion Start Time: 0263  Infusion Stop Time: 7858  Post Infusion IV Care: Peripheral IV Discontinued  Discharge: Condition: Good, Destination: Home . AVS Declined  Performed by:  Arnoldo Morale, RN

## 2022-06-25 ENCOUNTER — Ambulatory Visit (INDEPENDENT_AMBULATORY_CARE_PROVIDER_SITE_OTHER): Payer: BC Managed Care – PPO | Admitting: *Deleted

## 2022-06-25 VITALS — BP 123/80 | HR 79 | Temp 98.0°F | Resp 18 | Ht 65.0 in | Wt 164.8 lb

## 2022-06-25 DIAGNOSIS — D509 Iron deficiency anemia, unspecified: Secondary | ICD-10-CM

## 2022-06-25 DIAGNOSIS — R790 Abnormal level of blood mineral: Secondary | ICD-10-CM

## 2022-06-25 DIAGNOSIS — D500 Iron deficiency anemia secondary to blood loss (chronic): Secondary | ICD-10-CM

## 2022-06-25 MED ORDER — DIPHENHYDRAMINE HCL 25 MG PO CAPS: 25.0000 mg | ORAL_CAPSULE | Freq: Once | ORAL | Status: DC

## 2022-06-25 MED ORDER — SODIUM CHLORIDE 0.9 % IV SOLN
200.0000 mg | Freq: Once | INTRAVENOUS | Status: AC
  Administered 2022-06-25: 200 mg via INTRAVENOUS
  Filled 2022-06-25: qty 10

## 2022-06-25 MED ORDER — ACETAMINOPHEN 325 MG PO TABS: 650.0000 mg | ORAL_TABLET | Freq: Once | ORAL | Status: DC

## 2022-06-25 NOTE — Progress Notes (Signed)
Diagnosis: Iron Deficiency Anemia  Provider:  Marshell Garfinkel MD  Procedure: Infusion  IV Type: Peripheral, IV Location: L Forearm  Venofer (Iron Sucrose), Dose: 200 mg  Infusion Start Time: [id number] am  Infusion Stop Time: 1410 pm  Post Infusion IV Care: Observation period completed and Peripheral IV Discontinued  Discharge: Condition: Good, Destination: Home . AVS Provided and AVS Declined  Performed by:  Oren Beckmann, RN

## 2022-07-12 NOTE — Progress Notes (Signed)
  [facility] [address] [place] Phone: [phone] Subjective:   IVilma Meckel, am serving as a scribe for Dr. Hulan Saas.  I'm seeing this patient by the request  of:  Nicholes Rough, PA-C  CC: back and neck pain   [id number]  Dorothy Bowen is a 52 y.o. female coming in with complaint of back and neck pain. OMT on 05/09/2022.  Patient states tailbone pain is coming back. A lot of personal issues at the moment.   Found anemia and given transfusion        Reviewed prior external information including notes and imaging from previsou exam, outside providers and external EMR if available.   As well as notes that were available from care everywhere and other healthcare systems.  Past medical history, social, surgical and family history all reviewed in electronic medical record.  No pertanent information unless stated regarding to the chief complaint.   Past Medical History:  Diagnosis Date   Abnormal uterine bleeding (AUB)    Diabetes mellitus without complication (HCC)    Endometrial polyp    Hypertension    Iron deficiency anemia    MRSA infection    right hip from a spider bite, approx 13 years ago   Thyroid disease    Wears glasses     Allergies  Allergen Reactions   Shellfish Allergy Anaphylaxis   Erythromycin Nausea And Vomiting and Rash    Severe   Sulfa Antibiotics Rash     Review of Systems:  No headache, visual changes, nausea, vomiting, diarrhea, constipation, dizziness, abdominal pain, skin rash, fevers, chills, night sweats, weight loss, swollen lymph nodes, body aches, joint swelling, chest pain, shortness of breath, mood changes. POSITIVE muscle aches  Objective  Blood pressure 108/60, pulse 93, height '5\' 5"'$  (1.651 m), weight 162 lb (73.5 kg), SpO2 93 %.   General: No apparent distress alert and oriented x3 mood and affect normal, dressed appropriately.  HEENT: Pupils equal, extraocular  movements intact  Respiratory: Patient's speak in full sentences and does not appear short of breath  Cardiovascular: No lower extremity edema, non tender, no erythema  MSK:  Back there is a lot of loss of lordosis.  Significant tightness noted in the thoracolumbar juncture.  Patient does have some pain in the coccydynia area.  Osteopathic findings  C2 flexed rotated and side bent right C7 flexed rotated and side bent left T3 extended rotated and side bent right inhaled rib T8 extended rotated and side bent left L2 flexed rotated and side bent right Sacrum right on right    Assessment and Plan:  No problem-specific Assessment & Plan notes found for this encounter.    Nonallopathic problems  Decision today to treat with OMT was based on Physical Exam  After verbal consent patient was treated with HVLA, ME, FPR techniques in cervical, rib, thoracic, lumbar, and sacral  areas  Patient tolerated the procedure well with improvement in symptoms  Patient given exercises, stretches and lifestyle modifications  See medications in patient instructions if given  Patient will follow up in 4-8 weeks     The above documentation has been reviewed and is accurate and complete Lyndal Pulley, DO         Note: This dictation was prepared with Dragon dictation along with smaller phrase technology. Any transcriptional errors that result from this process are unintentional.

## 2022-07-17 ENCOUNTER — Ambulatory Visit: Payer: BC Managed Care – PPO | Admitting: Family Medicine

## 2022-07-17 VITALS — BP 108/60 | HR 93 | Ht 65.0 in | Wt 162.0 lb

## 2022-07-17 DIAGNOSIS — M533 Sacrococcygeal disorders, not elsewhere classified: Secondary | ICD-10-CM | POA: Diagnosis not present

## 2022-07-17 DIAGNOSIS — M9904 Segmental and somatic dysfunction of sacral region: Secondary | ICD-10-CM

## 2022-07-17 DIAGNOSIS — M9901 Segmental and somatic dysfunction of cervical region: Secondary | ICD-10-CM

## 2022-07-17 DIAGNOSIS — M797 Fibromyalgia: Secondary | ICD-10-CM

## 2022-07-17 DIAGNOSIS — M2550 Pain in unspecified joint: Secondary | ICD-10-CM | POA: Diagnosis not present

## 2022-07-17 DIAGNOSIS — M9908 Segmental and somatic dysfunction of rib cage: Secondary | ICD-10-CM | POA: Diagnosis not present

## 2022-07-17 DIAGNOSIS — R790 Abnormal level of blood mineral: Secondary | ICD-10-CM

## 2022-07-17 DIAGNOSIS — M9902 Segmental and somatic dysfunction of thoracic region: Secondary | ICD-10-CM

## 2022-07-17 DIAGNOSIS — M9903 Segmental and somatic dysfunction of lumbar region: Secondary | ICD-10-CM | POA: Diagnosis not present

## 2022-07-17 LAB — VITAMIN D 25 HYDROXY (VIT D DEFICIENCY, FRACTURES): VITD: 33.08 ng/mL (ref 30.00–100.00)

## 2022-07-17 LAB — IBC PANEL
Iron: 118 ug/dL (ref 42–145)
Saturation Ratios: 38.7 % (ref 20.0–50.0)
TIBC: 305.2 ug/dL (ref 250.0–450.0)
Transferrin: 218 mg/dL (ref 212.0–360.0)

## 2022-07-17 LAB — FERRITIN: Ferritin: 179.3 ng/mL (ref 10.0–291.0)

## 2022-07-17 MED ORDER — DULOXETINE HCL 60 MG PO CPEP: 60.0000 mg | ORAL_CAPSULE | Freq: Every day | ORAL | 2 refills | Status: DC

## 2022-07-17 NOTE — Patient Instructions (Addendum)
Labs today Increase Cymbalta to '60mg'$  See you again in 2 months

## 2022-07-18 NOTE — Assessment & Plan Note (Signed)
Recheck after transfusion.

## 2022-07-18 NOTE — Assessment & Plan Note (Signed)
Chronic problem with worsening symptoms.  Increasing the Cymbalta to 60 mg to see how patient responds.  Does have a lot of other stressors that could also be potentially playing a role.  Patient did have a low ferritin and will recheck with patient now having the transfusion.  Will follow-up again in 6 to 8 weeks

## 2022-07-18 NOTE — Assessment & Plan Note (Signed)
Increase Cymbalta to 60 mg daily.

## 2022-07-26 ENCOUNTER — Other Ambulatory Visit: Payer: Self-pay | Admitting: Family Medicine

## 2022-07-27 ENCOUNTER — Encounter: Payer: Self-pay | Admitting: Family Medicine

## 2022-09-11 ENCOUNTER — Other Ambulatory Visit: Payer: Self-pay | Admitting: Family Medicine

## 2022-09-18 ENCOUNTER — Ambulatory Visit: Payer: BC Managed Care – PPO | Admitting: Family Medicine

## 2022-10-09 NOTE — Progress Notes (Unsigned)
  [name] [address] [place] [address] Phone: [phone] Subjective:   Dorothy Bowen, am serving as a scribe for Dr. Antoine Primas.  I'm seeing this patient by the request  of:  Ladora Daniel, PA-C  CC: back and neck pain   [id number]  Wyetta Delisia Vanalstine is a 52 y.o. female coming in with complaint of back and neck pain. OMT on 07/17/2022. Patient states that she has intermittent pain. Patient states that she is having pain other than her back. Pain in L elbow, R middle finger and coccyx. No injury to these joints. Pain over medial epicondyle. Does not use hands at work much.   Medications patient has been prescribed: allopurinol cymbalta vit d  Taking:      Reviewed prior external information including notes and imaging from previsou exam, outside providers and external EMR if available.   As well as notes that were available from care everywhere and other healthcare systems.  Past medical history, social, surgical and family history all reviewed in electronic medical record.  No pertanent information unless stated regarding to the chief complaint.   Past Medical History:  Diagnosis Date   Abnormal uterine bleeding (AUB)    Diabetes mellitus without complication (HCC)    Endometrial polyp    Hypertension    Iron deficiency anemia    MRSA infection    right hip from a spider bite, approx 13 years ago   Thyroid disease    Wears glasses     Allergies  Allergen Reactions   Shellfish Allergy Anaphylaxis   Erythromycin Nausea And Vomiting and Rash    Severe   Sulfa Antibiotics Rash     Review of Systems:  No headache, visual changes, nausea, vomiting, diarrhea, constipation, dizziness, abdominal pain, skin rash, fevers, chills, night sweats, weight loss, swollen lymph nodes, body aches, joint swelling, chest pain, shortness of breath, mood changes. POSITIVE muscle aches  Objective  Blood pressure 110/80, pulse 86, height  5\' 5"  (1.651 m), weight 153 lb (69.4 kg), SpO2 98 %.   General: No apparent distress alert and oriented x3 mood and affect normal, dressed appropriately.  HEENT: Pupils equal, extraocular movements intact  Respiratory: Patient's speak in full sentences and does not appear short of breath  Cardiovascular: No lower extremity edema, non tender, no erythema  Patient is sitting in insulin some tenderness overall.         Assessment and Plan:  Fibromyalgia Elevated calcium recently.  Also elevated uric acid as well.  Recently elevated A1c which is different with patient.  Type II diabetic.  Will recheck emergency as well as insulin levels to see if patient is having more of a mix of type I and type II at this time.  I do think that endocrinology could be beneficial as well.  Follow-up with me again in 2 weeks.  Total time reviewing patient's chart and discussing with patient 31 minutes        The above documentation has been reviewed and is accurate and complete Judi Saa, DO         Note: This dictation was prepared with Dragon dictation along with smaller phrase technology. Any transcriptional errors that result from this process are unintentional.

## 2022-10-11 ENCOUNTER — Ambulatory Visit: Payer: BC Managed Care – PPO | Admitting: Family Medicine

## 2022-10-11 VITALS — BP 110/80 | HR 86 | Ht 65.0 in | Wt 153.0 lb

## 2022-10-11 DIAGNOSIS — M797 Fibromyalgia: Secondary | ICD-10-CM

## 2022-10-11 DIAGNOSIS — M2550 Pain in unspecified joint: Secondary | ICD-10-CM | POA: Diagnosis not present

## 2022-10-11 LAB — COMPREHENSIVE METABOLIC PANEL
ALT: 35 U/L (ref 0–35)
AST: 23 U/L (ref 0–37)
Albumin: 4.7 g/dL (ref 3.5–5.2)
Alkaline Phosphatase: 71 U/L (ref 39–117)
BUN: 14 mg/dL (ref 6–23)
CO2: 32 mEq/L (ref 19–32)
Calcium: 10.4 mg/dL (ref 8.4–10.5)
Chloride: 96 mEq/L (ref 96–112)
Creatinine, Ser: 0.76 mg/dL (ref 0.40–1.20)
GFR: 91 mL/min (ref >60.00)
Glucose, Bld: 204 mg/dL — ABNORMAL HIGH (ref 70–99)
Potassium: 4.3 mEq/L (ref 3.5–5.1)
Sodium: 137 mEq/L (ref 135–145)
Total Bilirubin: 0.3 mg/dL (ref 0.2–1.2)
Total Protein: 8.5 g/dL — ABNORMAL HIGH (ref 6.0–8.3)

## 2022-10-11 LAB — SEDIMENTATION RATE: Sed Rate: 46 mm/hr — ABNORMAL HIGH (ref 0–30)

## 2022-10-11 MED ORDER — DULOXETINE HCL 60 MG PO CPEP: 60.0000 mg | ORAL_CAPSULE | Freq: Every day | ORAL | 2 refills | Status: DC

## 2022-10-11 NOTE — Patient Instructions (Addendum)
[facility]  today Would consider endocrinology See me in 2-3 months

## 2022-10-11 NOTE — Assessment & Plan Note (Signed)
Elevated calcium recently.  Also elevated uric acid as well.  Recently elevated A1c which is different with patient.  Type II diabetic.  Will recheck emergency as well as insulin levels to see if patient is having more of a mix of type I and type II at this time.  I do think that endocrinology could be beneficial as well.  Follow-up with me again in 2 weeks.  Total time reviewing patient's chart and discussing with patient 31 minutes

## 2022-10-15 ENCOUNTER — Other Ambulatory Visit: Payer: Self-pay | Admitting: Family Medicine

## 2022-10-18 LAB — CALCIUM, IONIZED: Calcium, Ion: 5.2 mg/dL (ref 4.7–5.5)

## 2022-10-18 LAB — PTH, INTACT AND CALCIUM
Calcium: 10.3 mg/dL (ref 8.6–10.4)
PTH: 33 pg/mL (ref 16–77)

## 2022-10-18 LAB — HEMOGLOBIN A1C
Hgb A1c MFr Bld: 9.6 % of total Hgb — ABNORMAL HIGH (ref <5.7)
Mean Plasma Glucose: 229 mg/dL
eAG (mmol/L): 12.7 mmol/L

## 2022-10-18 LAB — PTH-RELATED PEPTIDE: PTH-Related Protein (PTH-RP): 11 pg/mL (ref 11–20)

## 2022-10-21 ENCOUNTER — Other Ambulatory Visit: Payer: Self-pay | Admitting: Family Medicine

## 2022-11-21 ENCOUNTER — Other Ambulatory Visit: Payer: Self-pay | Admitting: Family Medicine

## 2022-12-12 LAB — COLOGUARD: COLOGUARD: POSITIVE — AB

## 2022-12-18 NOTE — Progress Notes (Unsigned)
[name] [address] [place] [address] Phone: [phone] Subjective:   [name], am serving as a scribe for Dr. Antoine Primas.  I'm seeing this patient by the request  of:  Ladora Daniel, PA-C  CC:   [id number]  10/11/2022 Elevated calcium recently.  Also elevated uric acid as well.  Recently elevated A1c which is different with patient.  Type II diabetic.  Will recheck emergency as well as insulin levels to see if patient is having more of a mix of type I and type II at this time.  I do think that endocrinology could be beneficial as well.  Follow-up with me again in 2 weeks.  Total time reviewing patient's chart and discussing with patient 31 minutes     Updated 12/20/2022 Dorothy Bowen is a 52 y.o. female coming in with complaint of polyarthralgia. Going to GI because of positive test. Tailbone and and headaches have their good and bad days. Also having some elbow pain.      Past Medical History:  Diagnosis Date   Abnormal uterine bleeding (AUB)    Diabetes mellitus without complication (HCC)    Endometrial polyp    Hypertension    Iron deficiency anemia    MRSA infection    right hip from a spider bite, approx 13 years ago   Thyroid disease    Wears glasses    Past Surgical History:  Procedure Laterality Date   DILATATION & CURETTAGE/HYSTEROSCOPY WITH MYOSURE N/A 08/08/2018   Procedure: DILATATION & CURETTAGE/HYSTEROSCOPY WITH MYOSURE/ENDOMETRIAL POLYPECTOMY;  Surgeon: [name], MD;  Location: [facility];  Service: Gynecology;  Laterality: N/A;  NON-ELECTIVE PER MD   DILITATION & CURRETTAGE/HYSTROSCOPY WITH NOVASURE ABLATION N/A 11/06/2018   Procedure: DILATATION & CURETTAGE/HYSTEROSCOPY WITH NOVASURE ABLATION;  Surgeon: [name], MD;  Location: [facility];  Service: Gynecology;  Laterality: N/A;   TUBAL LIGATION     Social History   Socioeconomic History   Marital  status: Widowed    Spouse name: Not on file   Number of children: Not on file   Years of education: Not on file   Highest education level: Not on file  Occupational History   Not on file  Tobacco Use   Smoking status: Never   Smokeless tobacco: Never  Vaping Use   Vaping status: Never Used  Substance and Sexual Activity   Alcohol use: Not Currently   Drug use: No   Sexual activity: Never    Birth control/protection: Surgical  Other Topics Concern   Not on file  Social History Narrative   Not on file   Social Determinants of Health   Financial Resource Strain: Low Risk  (09/11/2022)   Received from [facility], [facility]   Overall Financial Resource Strain (CARDIA)    Difficulty of Paying Living Expenses: Not hard at all  Food Insecurity: No Food Insecurity (09/11/2022)   Received from [facility], [facility]   Hunger Vital Sign    Worried About Running Out of Food in the Last Year: Never true    Ran Out of Food in the Last Year: Never true  Transportation Needs: No Transportation Needs (09/11/2022)   Received from [employer], [facility]   PRAPARE - Transportation    Lack of Transportation (Medical): No    Lack of Transportation (Non-Medical): No  Physical Activity: Sufficiently Active (09/11/2022)   Received from [facility], [facility]   Exercise Vital Sign  Days of Exercise per Week: 3 days    Minutes of Exercise per Session: 50 min  Stress: No Stress Concern Present (09/11/2022)   Received from [facility], [facility] of Occupational Health - Occupational Stress Questionnaire    Feeling of Stress : Not at all  Social Connections: Moderately Integrated (09/11/2022)   Received from [facility], [facility]   Social Network    How would you rate your social network (family, work, friends)?: Adequate participation with social networks   Allergies  Allergen Reactions   Shellfish Allergy Anaphylaxis   Erythromycin  Nausea And Vomiting and Rash    Severe   Sulfa Antibiotics Rash   No family history on file.  Current Outpatient Medications (Endocrine & Metabolic):    levothyroxine (SYNTHROID) 88 MCG tablet, Take 88 mcg by mouth daily before breakfast.   metFORMIN (GLUCOPHAGE) 1000 MG tablet, Take 1,000 mg by mouth 2 (two) times daily with a meal.   Semaglutide,0.25 or 0.5MG /DOS, (OZEMPIC, 0.25 OR 0.5 MG/DOSE,) 2 MG/3ML SOPN, Inject into the skin.   sitaGLIPtin (JANUVIA) 100 MG tablet, Take 100 mg by mouth daily.  Current Outpatient Medications (Cardiovascular):    lisinopril-hydrochlorothiazide (ZESTORETIC) 20-25 MG tablet, Take 1 tablet by mouth daily.  Current Outpatient Medications (Respiratory):    montelukast (SINGULAIR) 10 MG tablet, Take 1 tablet (10 mg total) by mouth at bedtime.  Current Outpatient Medications (Analgesics):    allopurinol (ZYLOPRIM) 100 MG tablet, Take 2 tablets by mouth once daily   aspirin EC 325 MG tablet, Take 325 mg by mouth daily.   ibuprofen (ADVIL,MOTRIN) 800 MG tablet, Take 1 tablet (800 mg total) by mouth every 8 (eight) hours as needed for pain.   Current Outpatient Medications (Other):    DULoxetine (CYMBALTA) 60 MG capsule, Take 1 capsule (60 mg total) by mouth daily.   pregabalin (LYRICA) 150 MG capsule, Take 150 mg by mouth 2 (two) times daily.   Vitamin D, Ergocalciferol, (DRISDOL) 1.25 MG (50000 UNIT) CAPS capsule, Take 1 capsule by mouth once a week   Reviewed prior external information including notes and imaging from  primary care provider As well as notes that were available from care everywhere and other healthcare systems.  Past medical history, social, surgical and family history all reviewed in electronic medical record.  No pertanent information unless stated regarding to the chief complaint.   Review of Systems:  No  visual changes, nausea, vomiting, diarrhea, constipation, dizziness, abdominal pain, skin rash, fevers, chills, night sweats,  weight loss, swollen lymph nodes, , joint swelling, chest pain, shortness of breath, mood changes. POSITIVE muscle aches, body aches, headaches  Objective  Blood pressure 108/76, pulse (!) 107, height 5\' 5"  (1.651 m), weight 148 lb (67.1 kg), SpO2 90%.   General: No apparent distress alert and oriented x3 mood and affect normal, dressed appropriately.  HEENT: Pupils equal, extraocular movements intact  Respiratory: Patient's speak in full sentences and does not appear short of breath  Cardiovascular: No lower extremity edema, non tender, no erythema  Low back does have some loss of lordosis noted.  Patient is sitting relatively comfortably but does seem to be somewhat fatigued.    Impression and Recommendations:    The above documentation has been reviewed and is accurate and complete Judi Saa, DO

## 2022-12-20 ENCOUNTER — Encounter: Payer: Self-pay | Admitting: Family Medicine

## 2022-12-20 ENCOUNTER — Ambulatory Visit: Payer: BC Managed Care – PPO | Admitting: Family Medicine

## 2022-12-20 VITALS — BP 108/76 | HR 107 | Ht 65.0 in | Wt 148.0 lb

## 2022-12-20 DIAGNOSIS — M797 Fibromyalgia: Secondary | ICD-10-CM

## 2022-12-20 DIAGNOSIS — M533 Sacrococcygeal disorders, not elsewhere classified: Secondary | ICD-10-CM

## 2022-12-20 DIAGNOSIS — R790 Abnormal level of blood mineral: Secondary | ICD-10-CM

## 2022-12-20 MED ORDER — MONTELUKAST SODIUM 10 MG PO TABS: 10.0000 mg | ORAL_TABLET | Freq: Every day | ORAL | 1 refills | Status: AC

## 2022-12-20 NOTE — Patient Instructions (Signed)
Prescribed Singular Continue the other medicine See GI and see what they have to say See you again in 2 months

## 2022-12-20 NOTE — Assessment & Plan Note (Signed)
Could be secondary to the loss in the GI system.  Will see with patient having a follow-up on the 19th with them for further evaluation.

## 2022-12-20 NOTE — Assessment & Plan Note (Signed)
Patient was on Singulair for some of her URI symptoms previously by an ENT and felt like that was making some discomfort better as well.  Was given another prescription for this today.

## 2022-12-20 NOTE — Assessment & Plan Note (Signed)
Patient is on coccydynia noted.  Discussed icing regimen discussed with patient going down to with the positive Cologuard.  Encouraged her to go see GI to make sure nothing else is contributing.  We discussed how different things such as  Hemorrhoids to  gastric ulcers could be potentially causing some difficulty.  Patient has also had the iron deficiency that could be this as well as had elevated uric acid previously.  Patient is also had difficulty with her diabetes recently.  Discussed which activities to do and which ones to avoid.  Follow-up again in 6 to 8 weeks.

## 2023-01-13 ENCOUNTER — Other Ambulatory Visit: Payer: Self-pay | Admitting: Family Medicine

## 2023-01-18 ENCOUNTER — Other Ambulatory Visit: Payer: Self-pay | Admitting: Family Medicine

## 2023-02-18 NOTE — Progress Notes (Unsigned)
[name] [address] [place] [address] Phone: [phone] Subjective:   Dorothy Bowen, am serving as a scribe for Dr. Antoine Primas.  I'm seeing this patient by the request  of:  Ladora Daniel, PA-C  CC: Follow-up with pain follow-up  [id number]  12/20/2022 Patient was on Singulair for some of her URI symptoms previously by an ENT and felt like that was making some discomfort better as well. Was given another prescription for this today.  Could be secondary to the loss in the GI system.  Will see with patient having a follow-up on the 19th with them for further evaluation.  Patient is on coccydynia noted.  Discussed icing regimen discussed with patient going down to with the positive Cologuard.  Encouraged her to go see GI to make sure nothing else is contributing.  We discussed how different things such as   Hemorrhoids to  gastric ulcers could be potentially causing some difficulty.  Patient has also had the iron deficiency that could be this as well as had elevated uric acid previously.  Patient is also had difficulty with her diabetes recently.  Discussed which activities to do and which ones to avoid.  Follow-up again in 6 to 8 weeks.  Updated 02/19/2023 Dorothy Bowen is a 52 y.o. female coming in with complaint of had colonoscopy. Came back good. Over all pain, talk about that.  Patient continues to have difficulty.  Affecting daily activities, sometimes she is so tired she cannot do anything and other times she is up with 30 hours at a time.  Denies though any days where she feels good.  States that it is becoming difficult to even do daily activities sometimes.       Past Medical History:  Diagnosis Date   Abnormal uterine bleeding (AUB)    Diabetes mellitus without complication (HCC)    Endometrial polyp    Hypertension    Iron deficiency anemia    MRSA infection    right hip from a spider bite, approx 13 years ago    Thyroid disease    Wears glasses    Past Surgical History:  Procedure Laterality Date   DILATATION & CURETTAGE/HYSTEROSCOPY WITH MYOSURE N/A 08/08/2018   Procedure: DILATATION & CURETTAGE/HYSTEROSCOPY WITH MYOSURE/ENDOMETRIAL POLYPECTOMY;  Surgeon: [name], MD;  Location: [facility];  Service: Gynecology;  Laterality: N/A;  NON-ELECTIVE PER MD   DILITATION & CURRETTAGE/HYSTROSCOPY WITH NOVASURE ABLATION N/A 11/06/2018   Procedure: DILATATION & CURETTAGE/HYSTEROSCOPY WITH NOVASURE ABLATION;  Surgeon: [name], MD;  Location: [facility];  Service: Gynecology;  Laterality: N/A;   TUBAL LIGATION     Social History   Socioeconomic History   Marital status: Widowed    Spouse name: Not on file   Number of children: Not on file   Years of education: Not on file   Highest education level: Not on file  Occupational History   Not on file  Tobacco Use   Smoking status: Never   Smokeless tobacco: Never  Vaping Use   Vaping status: Never Used  Substance and Sexual Activity   Alcohol use: Not Currently   Drug use: No   Sexual activity: Never    Birth control/protection: Surgical  Other Topics Concern   Not on file  Social History Narrative   Not on file   Social Determinants of Health   Financial Resource Strain: Low Risk  (09/11/2022)   Received from [facility], [facility]  Overall Financial Resource Strain (CARDIA)    Difficulty of Paying Living Expenses: Not hard at all  Food Insecurity: No Food Insecurity (09/11/2022)   Received from [facility], [facility]   Hunger Vital Sign    Worried About Running Out of Food in the Last Year: Never true    Ran Out of Food in the Last Year: Never true  Transportation Needs: No Transportation Needs (09/11/2022)   Received from [facility], [facility]   PRAPARE - Transportation    Lack of Transportation (Medical): No    Lack of Transportation (Non-Medical): No  Physical Activity:  Sufficiently Active (09/11/2022)   Received from [facility], [facility]   Exercise Vital Sign    Days of Exercise per Week: 3 days    Minutes of Exercise per Session: 50 min  Stress: No Stress Concern Present (09/11/2022)   Received from [facility], [facility] of Occupational Health - Occupational Stress Questionnaire    Feeling of Stress : Not at all  Social Connections: Moderately Integrated (09/11/2022)   Received from [facility], [facility]   Social Network    How would you rate your social network (family, work, friends)?: Adequate participation with social networks   Allergies  Allergen Reactions   Shellfish Allergy Anaphylaxis   Erythromycin Nausea And Vomiting and Rash    Severe   Sulfa Antibiotics Rash   No family history on file.  Current Outpatient Medications (Endocrine & Metabolic):    levothyroxine (SYNTHROID) 88 MCG tablet, Take 88 mcg by mouth daily before breakfast.   metFORMIN (GLUCOPHAGE) 1000 MG tablet, Take 1,000 mg by mouth 2 (two) times daily with a meal.   Semaglutide,0.25 or 0.5MG /DOS, (OZEMPIC, 0.25 OR 0.5 MG/DOSE,) 2 MG/3ML SOPN, Inject into the skin.   sitaGLIPtin (JANUVIA) 100 MG tablet, Take 100 mg by mouth daily.  Current Outpatient Medications (Cardiovascular):    lisinopril-hydrochlorothiazide (ZESTORETIC) 20-25 MG tablet, Take 1 tablet by mouth daily.  Current Outpatient Medications (Respiratory):    montelukast (SINGULAIR) 10 MG tablet, Take 1 tablet (10 mg total) by mouth at bedtime.  Current Outpatient Medications (Analgesics):    allopurinol (ZYLOPRIM) 100 MG tablet, Take 2 tablets by mouth once daily   aspirin EC 325 MG tablet, Take 325 mg by mouth daily.   ibuprofen (ADVIL,MOTRIN) 800 MG tablet, Take 1 tablet (800 mg total) by mouth every 8 (eight) hours as needed for pain.   Current Outpatient Medications (Other):    DULoxetine (CYMBALTA) 60 MG capsule, Take 1 capsule by mouth once daily    pregabalin (LYRICA) 150 MG capsule, Take 150 mg by mouth 2 (two) times daily.   Vitamin D, Ergocalciferol, (DRISDOL) 1.25 MG (50000 UNIT) CAPS capsule, Take 1 capsule by mouth once a week   Reviewed prior external information including notes and imaging from  primary care provider As well as notes that were available from care everywhere and other healthcare systems.  Past medical history, social, surgical and family history all reviewed in electronic medical record.  No pertanent information unless stated regarding to the chief complaint.   Review of Systems:  No visual changes, nausea, vomiting, diarrhea, constipation, dizziness, abdominal pain, skin rash, fevers, chills, night sweats, weight loss, swollen lymph nodes,chest pain, shortness of breath, mood changes. POSITIVE muscle aches, body aches, joint swelling, headache  Objective  Blood pressure 116/88, pulse 88, height 5\' 5"  (1.651 m), weight 150 lb (68 kg), SpO2 97%.   General: No apparent distress alert and  oriented x3 mood and affect normal, dressed appropriately.  HEENT: Pupils equal, extraocular movements intact  Respiratory: Patient's speak in full sentences and does not appear short of breath  Cardiovascular: No lower extremity edema, non tender, no erythema  Low back exam does have some loss lordosis noted.  Some tenderness to palpation in the paraspinal musculature.  Tightness noted with Pearlean Brownie right greater than left.    Impression and Recommendations:    The above documentation has been reviewed and is accurate and complete Judi Saa, DO

## 2023-02-19 ENCOUNTER — Other Ambulatory Visit: Payer: Self-pay | Admitting: Family Medicine

## 2023-02-19 ENCOUNTER — Encounter: Payer: Self-pay | Admitting: Family Medicine

## 2023-02-19 ENCOUNTER — Ambulatory Visit: Payer: BC Managed Care – PPO | Admitting: Family Medicine

## 2023-02-19 VITALS — BP 116/88 | HR 88 | Ht 65.0 in | Wt 150.0 lb

## 2023-02-19 DIAGNOSIS — M797 Fibromyalgia: Secondary | ICD-10-CM | POA: Diagnosis not present

## 2023-02-19 DIAGNOSIS — R7989 Other specified abnormal findings of blood chemistry: Secondary | ICD-10-CM

## 2023-02-19 DIAGNOSIS — M2550 Pain in unspecified joint: Secondary | ICD-10-CM

## 2023-02-19 DIAGNOSIS — R790 Abnormal level of blood mineral: Secondary | ICD-10-CM | POA: Diagnosis not present

## 2023-02-19 DIAGNOSIS — F32A Depression, unspecified: Secondary | ICD-10-CM

## 2023-02-19 LAB — CBC WITH DIFFERENTIAL/PLATELET
Basophils Absolute: 0 10*3/uL (ref 0.0–0.1)
Basophils Relative: 0.8 % (ref 0.0–3.0)
Eosinophils Absolute: 0.2 10*3/uL (ref 0.0–0.7)
Eosinophils Relative: 2.5 % (ref 0.0–5.0)
HCT: 40.8 % (ref 36.0–46.0)
Hemoglobin: 13.6 g/dL (ref 12.0–15.0)
Lymphocytes Relative: 24 % (ref 12.0–46.0)
Lymphs Abs: 1.4 10*3/uL (ref 0.7–4.0)
MCHC: 33.5 g/dL (ref 30.0–36.0)
MCV: 89.1 fL (ref 78.0–100.0)
Monocytes Absolute: 0.4 10*3/uL (ref 0.1–1.0)
Monocytes Relative: 6.8 % (ref 3.0–12.0)
Neutro Abs: 4 10*3/uL (ref 1.4–7.7)
Neutrophils Relative %: 65.9 % (ref 43.0–77.0)
Platelets: 348 10*3/uL (ref 150.0–400.0)
RBC: 4.57 Mil/uL (ref 3.87–5.11)
RDW: 13.8 % (ref 11.5–15.5)
WBC: 6 10*3/uL (ref 4.0–10.5)

## 2023-02-19 LAB — TSH: TSH: 1.34 u[IU]/mL (ref 0.35–5.50)

## 2023-02-19 LAB — FOLLICLE STIMULATING HORMONE: FSH: 45.1 m[IU]/mL

## 2023-02-19 LAB — VITAMIN D 25 HYDROXY (VIT D DEFICIENCY, FRACTURES): VITD: 49.41 ng/mL (ref 30.00–100.00)

## 2023-02-19 LAB — LUTEINIZING HORMONE: LH: 12.81 m[IU]/mL

## 2023-02-19 LAB — VITAMIN B12: Vitamin B-12: 672 pg/mL (ref 211–911)

## 2023-02-19 LAB — FERRITIN: Ferritin: 85.7 ng/mL (ref 10.0–291.0)

## 2023-02-19 LAB — SEDIMENTATION RATE: Sed Rate: 28 mm/h (ref 0–30)

## 2023-02-19 NOTE — Assessment & Plan Note (Signed)
Has attempted multiple different medications and continues to have difficulty.  Discussed potentially Toradol and Depo-Medrol.  Discussed increasing activity slowly.  Still very difficult with patient not having anything specific at this time.  Will need to consider the possibility of pain management and low-dose naltrexone if this continues.  Follow-up with me again in 2 to 3 months.  Chronic problem with worsening symptoms

## 2023-02-19 NOTE — Patient Instructions (Addendum)
Labs today Follow up 2-3 months

## 2023-02-19 NOTE — Assessment & Plan Note (Signed)
Will recheck vitamin D as well.

## 2023-02-19 NOTE — Assessment & Plan Note (Signed)
Concerned that underlying depression could be also playing a role.  Patient also has not had great control of her diabetes.  Will continue to monitor.  Patient has other providers helping her with that she states.

## 2023-02-19 NOTE — Assessment & Plan Note (Signed)
Recheck low ferritin to see if this is potentially contributing at this time.

## 2023-02-20 LAB — COMPREHENSIVE METABOLIC PANEL
ALT: 27 U/L (ref 0–35)
AST: 18 U/L (ref 0–37)
Albumin: 4.5 g/dL (ref 3.5–5.2)
Alkaline Phosphatase: 65 U/L (ref 39–117)
BUN: 19 mg/dL (ref 6–23)
CO2: 31 meq/L (ref 19–32)
Calcium: 10.1 mg/dL (ref 8.4–10.5)
Chloride: 99 meq/L (ref 96–112)
Creatinine, Ser: 0.69 mg/dL (ref 0.40–1.20)
GFR: 100.53 mL/min (ref >60.00)
Glucose, Bld: 180 mg/dL — ABNORMAL HIGH (ref 70–99)
Potassium: 3.6 meq/L (ref 3.5–5.1)
Sodium: 139 meq/L (ref 135–145)
Total Bilirubin: 0.3 mg/dL (ref 0.2–1.2)
Total Protein: 7.5 g/dL (ref 6.0–8.3)

## 2023-02-20 LAB — URIC ACID: Uric Acid, Serum: 5 mg/dL (ref 2.4–7.0)

## 2023-02-20 LAB — IBC PANEL
Iron: 101 ug/dL (ref 42–145)
Saturation Ratios: 29.6 % (ref 20.0–50.0)
TIBC: 341.6 ug/dL (ref 250.0–450.0)
Transferrin: 244 mg/dL (ref 212.0–360.0)

## 2023-02-20 LAB — C-REACTIVE PROTEIN: CRP: <1 mg/dL (ref 0.5–20.0)

## 2023-02-27 ENCOUNTER — Other Ambulatory Visit: Payer: Self-pay | Admitting: Family Medicine

## 2023-03-01 LAB — PROTEIN ELECTROPHORESIS, SERUM
Albumin ELP: 4.5 g/dL (ref 3.8–4.8)
Alpha 1: 0.3 g/dL (ref 0.2–0.3)
Alpha 2: 0.7 g/dL (ref 0.5–0.9)
Beta 2: 0.6 g/dL — ABNORMAL HIGH (ref 0.2–0.5)
Beta Globulin: 0.5 g/dL (ref 0.4–0.6)
Gamma Globulin: 1.1 g/dL (ref 0.8–1.7)
Total Protein: 7.7 g/dL (ref 6.1–8.1)

## 2023-03-01 LAB — ANGIOTENSIN CONVERTING ENZYME: Angiotensin-Converting Enzyme: 8 U/L — ABNORMAL LOW (ref 9–67)

## 2023-03-01 LAB — ANA: Anti Nuclear Antibody (ANA): POSITIVE — AB

## 2023-03-01 LAB — RHEUMATOID FACTOR: Rheumatoid fact SerPl-aCnc: <10 [IU]/mL (ref <14)

## 2023-03-01 LAB — PTH-RELATED PEPTIDE: PTH-Related Protein (PTH-RP): 15 pg/mL (ref 11–20)

## 2023-03-01 LAB — ANTI-NUCLEAR AB-TITER (ANA TITER)
ANA TITER: 1:80 {titer} — ABNORMAL HIGH
ANA Titer 1: 1:80 {titer} — ABNORMAL HIGH

## 2023-03-01 LAB — PTH, INTACT AND CALCIUM
Calcium: 10.2 mg/dL (ref 8.6–10.4)
PTH: 53 pg/mL (ref 16–77)

## 2023-03-01 LAB — CALCIUM, IONIZED: Calcium, Ion: 5.1 mg/dL (ref 4.7–5.5)

## 2023-03-01 LAB — CA 125: CA 125: 11 U/mL (ref <35)

## 2023-03-01 LAB — CYCLIC CITRUL PEPTIDE ANTIBODY, IGG: Cyclic Citrullin Peptide Ab: <16 U

## 2023-03-19 ENCOUNTER — Other Ambulatory Visit: Payer: Self-pay | Admitting: Family Medicine

## 2023-03-25 ENCOUNTER — Other Ambulatory Visit: Payer: Self-pay | Admitting: Family Medicine

## 2023-04-15 ENCOUNTER — Other Ambulatory Visit: Payer: Self-pay | Admitting: Family Medicine

## 2023-04-30 NOTE — Progress Notes (Unsigned)
[facility] [address] [place] [address] Phone: [phone] Subjective:   Dorothy Bowen, am serving as a scribe for Dr. Antoine Bowen.  I'm seeing this patient by the request  of:  Dorothy Daniel, PA-C  CC: Multiple joint complaints  [id number]  02/19/2023 Concerned that underlying depression could be also playing a role.  Patient also has not had great control of her diabetes.  Will continue to monitor.  Patient has other providers helping her with that she states.     Has attempted multiple different medications and continues to have difficulty.  Discussed potentially Toradol and Depo-Medrol.  Discussed increasing activity slowly.  Still very difficult with patient not having anything specific at this time.  Will need to consider the possibility of pain management and low-dose naltrexone if this continues.  Follow-up with me again in 2 to 3 months.  Chronic problem with worsening symptoms     Will recheck vitamin D as well.   Recheck low ferritin to see if this is potentially contributing at this time.     Updated 05/01/2023 Dorothy Bowen is a 52 y.o. female coming in with complaint of polyarthralgia. Patient fell on Sunday on R hip. Pain over GT. Pain worse with movement.   Also continues to have neck tightness and L elbow pain. Headache for past 2.5 weeks.   Laboratory workup still showed a positive ANA but did not have any increasing titer.  Patient's blood glucose continues to be high.  Vitamin D did improve from 1 year ago of 29 now to 38.  B12 has improved from 149 to 600. Uric acid has improved from 7.9to 5.  Past Medical History:  Diagnosis Date   Abnormal uterine bleeding (AUB)    Diabetes mellitus without complication (HCC)    Endometrial polyp    Hypertension    Iron deficiency anemia    MRSA infection    right hip from a spider bite, approx 13 years ago   Thyroid disease    Wears glasses    Past Surgical  History:  Procedure Laterality Date   DILATATION & CURETTAGE/HYSTEROSCOPY WITH MYOSURE N/A 08/08/2018   Procedure: DILATATION & CURETTAGE/HYSTEROSCOPY WITH MYOSURE/ENDOMETRIAL POLYPECTOMY;  Surgeon: Dorothy Mackie, MD;  Location: [facility];  Service: Gynecology;  Laterality: N/A;  NON-ELECTIVE PER MD   DILITATION & CURRETTAGE/HYSTROSCOPY WITH NOVASURE ABLATION N/A 11/06/2018   Procedure: DILATATION & CURETTAGE/HYSTEROSCOPY WITH NOVASURE ABLATION;  Surgeon: Dorothy Mackie, MD;  Location: [facility];  Service: Gynecology;  Laterality: N/A;   TUBAL LIGATION     Social History   Socioeconomic History   Marital status: Widowed    Spouse name: Not on file   Number of children: Not on file   Years of education: Not on file   Highest education level: Not on file  Occupational History   Not on file  Tobacco Use   Smoking status: Never   Smokeless tobacco: Never  Vaping Use   Vaping status: Never Used  Substance and Sexual Activity   Alcohol use: Not Currently   Drug use: No   Sexual activity: Never    Birth control/protection: Surgical  Other Topics Concern   Not on file  Social History Narrative   Not on file   Social Drivers of Health   Financial Resource Strain: Low Risk  (09/11/2022)   Received from [facility], [facility]   Overall Financial Resource Strain (CARDIA)    Difficulty of Paying  Living Expenses: Not hard at all  Food Insecurity: No Food Insecurity (09/11/2022)   Received from [facility], [facility]   Hunger Vital Sign    Worried About Running Out of Food in the Last Year: Never true    Ran Out of Food in the Last Year: Never true  Transportation Needs: No Transportation Needs (09/11/2022)   Received from [facility], [facility]   [facility] - Transportation    Lack of Transportation (Medical): No    Lack of Transportation (Non-Medical): No  Physical Activity: Sufficiently Active (09/11/2022)   Received from [facility], [facility]   Exercise Vital Sign    Days of Exercise per Week: 3 days    Minutes of Exercise per Session: 50 min  Stress: No Stress Concern Present (09/11/2022)   Received from [facility], [facility] - Occupational Stress Questionnaire    Feeling of Stress : Not at all  Social Connections: Moderately Integrated (09/11/2022)   Received from [facility], [facility]   Social Network    How would you rate your social network (family, work, friends)?: Adequate participation with social networks   Allergies  Allergen Reactions   Shellfish Allergy Anaphylaxis   Erythromycin Nausea And Vomiting and Rash    Severe   Sulfa Antibiotics Rash   No family history on file.  Current Outpatient Medications (Endocrine & Metabolic):    levothyroxine (SYNTHROID) 100 MCG tablet, Take 1 tablet (100 mcg total) by mouth daily before breakfast.   metFORMIN (GLUCOPHAGE) 1000 MG tablet, Take 1,000 mg by mouth 2 (two) times daily with a meal.   Semaglutide,0.25 or 0.5MG /DOS, (OZEMPIC, 0.25 OR 0.5 MG/DOSE,) 2 MG/3ML SOPN, Inject into the skin.   sitaGLIPtin (JANUVIA) 100 MG tablet, Take 100 mg by mouth daily.  Current Outpatient Medications (Cardiovascular):    lisinopril-hydrochlorothiazide (ZESTORETIC) 20-25 MG tablet, Take 1 tablet by mouth daily.  Current Outpatient Medications (Respiratory):    montelukast (SINGULAIR) 10 MG tablet, Take 1 tablet (10 mg total) by mouth at bedtime.  Current Outpatient Medications (Analgesics):    allopurinol (ZYLOPRIM) 100 MG tablet, Take 2 tablets by mouth once daily   aspirin EC 325 MG tablet, Take 325 mg by mouth daily.   ibuprofen (ADVIL,MOTRIN) 800 MG tablet, Take 1 tablet (800 mg total) by mouth every 8 (eight) hours as needed for pain.   Current Outpatient Medications (Other):    DULoxetine (CYMBALTA) 60 MG capsule, Take 1 capsule by mouth once daily   pregabalin (LYRICA) 150 MG capsule, Take 150  mg by mouth 2 (two) times daily.   Vitamin D, Ergocalciferol, (DRISDOL) 1.25 MG (50000 UNIT) CAPS capsule, Take 1 capsule by mouth once a week   Reviewed prior external information including notes and imaging from  primary care provider As well as notes that were available from care everywhere and other healthcare systems.  Past medical history, social, surgical and family history all reviewed in electronic medical record.  No pertanent information unless stated regarding to the chief complaint.   Review of Systems:  No headache, visual changes, nausea, vomiting, diarrhea, constipation, dizziness, abdominal pain, skin rash, fevers, chills, night sweats, weight loss, swollen lymph nodes, body aches, joint swelling, chest pain, shortness of breath, mood changes. POSITIVE muscle aches, weight gain, fatigue  Objective  Blood pressure 106/70, pulse 73, height 5\' 5"  (1.651 m), weight 156 lb (70.8 kg), SpO2 99%.   General: No apparent distress alert and oriented x3 mood and  affect normal, dressed appropriately.  A little more irritable today. HEENT: Pupils equal, extraocular movements intact  Respiratory: Patient's speak in full sentences and does not appear short of breath  Cardiovascular: No lower extremity edema, non tender, no erythema  Patient is sitting relatively comfortable.  More tenderness over the greater trochanteric area.    Impression and Recommendations:    The above documentation has been reviewed and is accurate and complete Judi Saa, DO

## 2023-05-01 ENCOUNTER — Ambulatory Visit (INDEPENDENT_AMBULATORY_CARE_PROVIDER_SITE_OTHER): Payer: BC Managed Care – PPO | Admitting: Family Medicine

## 2023-05-01 ENCOUNTER — Encounter: Payer: Self-pay | Admitting: Family Medicine

## 2023-05-01 VITALS — BP 106/70 | HR 73 | Ht 65.0 in | Wt 156.0 lb

## 2023-05-01 DIAGNOSIS — M797 Fibromyalgia: Secondary | ICD-10-CM | POA: Diagnosis not present

## 2023-05-01 DIAGNOSIS — E039 Hypothyroidism, unspecified: Secondary | ICD-10-CM | POA: Diagnosis not present

## 2023-05-01 DIAGNOSIS — M130 Polyarthritis, unspecified: Secondary | ICD-10-CM

## 2023-05-01 DIAGNOSIS — E790 Hyperuricemia without signs of inflammatory arthritis and tophaceous disease: Secondary | ICD-10-CM

## 2023-05-01 DIAGNOSIS — Z794 Long term (current) use of insulin: Secondary | ICD-10-CM

## 2023-05-01 DIAGNOSIS — E119 Type 2 diabetes mellitus without complications: Secondary | ICD-10-CM

## 2023-05-01 DIAGNOSIS — M2550 Pain in unspecified joint: Secondary | ICD-10-CM

## 2023-05-01 DIAGNOSIS — R790 Abnormal level of blood mineral: Secondary | ICD-10-CM

## 2023-05-01 MED ORDER — METHYLPREDNISOLONE ACETATE 80 MG/ML IJ SUSP
80.0000 mg | Freq: Once | INTRAMUSCULAR | Status: AC
  Administered 2023-05-01: 80 mg via INTRAMUSCULAR

## 2023-05-01 MED ORDER — LEVOTHYROXINE SODIUM 100 MCG PO TABS: 100.0000 ug | ORAL_TABLET | Freq: Every day | ORAL | 0 refills | Status: DC

## 2023-05-01 MED ORDER — KETOROLAC TROMETHAMINE 60 MG/2ML IM SOLN
60.0000 mg | Freq: Once | INTRAMUSCULAR | Status: AC
  Administered 2023-05-01: 60 mg via INTRAMUSCULAR

## 2023-05-01 NOTE — Patient Instructions (Signed)
Synthroid daily Injections in backside See me in 2-3 months

## 2023-05-01 NOTE — Assessment & Plan Note (Signed)
Patient has signs and symptoms consistent with worsening hypothyroidism.  Last T3 and T4 4 were increasing.  Will try 100 mcg and see if that will make any improvement with some of patient's symptoms.  Does have the underlying fibromyalgia we will continue to monitor.  Not making significant improvement yet at this time.

## 2023-05-01 NOTE — Assessment & Plan Note (Signed)
Continue with supplementation. 

## 2023-05-01 NOTE — Assessment & Plan Note (Signed)
Lab Results  Component Value Date   HGBA1C 9.6 (H) 10/11/2022   Discussed the importance of this.

## 2023-05-01 NOTE — Assessment & Plan Note (Signed)
Continue the Cymbalta at 60 mg.  Do not feel confident increasing anymore in this individual.  If necessary would have to change to another medication but hopefully not necessary.  Follow-up again in 2 months

## 2023-05-01 NOTE — Assessment & Plan Note (Signed)
Significant improvement on last lab draw.  Continue the allopurinol

## 2023-06-26 ENCOUNTER — Other Ambulatory Visit: Payer: Self-pay | Admitting: Family Medicine

## 2023-07-03 ENCOUNTER — Ambulatory Visit: Payer: BC Managed Care – PPO | Admitting: Family Medicine

## 2023-07-09 NOTE — Progress Notes (Unsigned)
 [facility] [address] [place] [address] Phone: [phone] Subjective:   Dorothy Bowen, am serving as a scribe for Dr. Antoine Primas.  I'm seeing this patient by the request  of:  Ladora Daniel, PA-C  CC: Headaches, back pain follow-up  [id number]  05/01/2023 Discussed the importance of this.   Continue with supplementation     Continue the Cymbalta at 60 mg.  Do not feel confident increasing anymore in this individual.  If necessary would have to change to another medication but hopefully not necessary.  Follow-up again in 2 months   Significant improvement on last lab draw.  Continue the allopurinol     Patient has signs and symptoms consistent with worsening hypothyroidism.  Last T3 and T4 4 were increasing.  Will try 100 mcg and see if that will make any improvement with some of patient's symptoms.  Does have the underlying fibromyalgia we will continue to monitor.  Not making significant improvement yet at this time.      Update 07/10/2023 Dorothy Bowen is a 53 y.o. female coming in with complaint of polyarthralgia. Patient states going over headaches and pain level.  Still taking 60 mg of Cymbalta.  Feels like she was doing relatively well.  Was losing weight but now gaining weight again.  Is on the Ozempic and is making her nauseous as well as having significant vomiting.  Discussed icing regimen and home exercises.    Past Medical History:  Diagnosis Date   Abnormal uterine bleeding (AUB)    Diabetes mellitus without complication (HCC)    Endometrial polyp    Hypertension    Iron deficiency anemia    MRSA infection    right hip from a spider bite, approx 13 years ago   Thyroid disease    Wears glasses    Past Surgical History:  Procedure Laterality Date   DILATATION & CURETTAGE/HYSTEROSCOPY WITH MYOSURE N/A 08/08/2018   Procedure: DILATATION & CURETTAGE/HYSTEROSCOPY WITH MYOSURE/ENDOMETRIAL POLYPECTOMY;   Surgeon: [name], MD;  Location: [facility];  Service: Gynecology;  Laterality: N/A;  NON-ELECTIVE PER MD   DILITATION & CURRETTAGE/HYSTROSCOPY WITH NOVASURE ABLATION N/A 11/06/2018   Procedure: DILATATION & CURETTAGE/HYSTEROSCOPY WITH NOVASURE ABLATION;  Surgeon: [name], MD;  Location[facility];  Service: Gynecology;  Laterality: N/A;   TUBAL LIGATION     Social History   Socioeconomic History   Marital status: Widowed    Spouse name: Not on file   Number of children: Not on file   Years of education: Not on file   Highest education level: Not on file  Occupational History   Not on file  Tobacco Use   Smoking status: Never   Smokeless tobacco: Never  Vaping Use   Vaping status: Never Used  Substance and Sexual Activity   Alcohol use: Not Currently   Drug use: No   Sexual activity: Never    Birth control/protection: Surgical  Other Topics Concern   Not on file  Social History Narrative   Not on file   Social Drivers of Health   Financial Resource Strain: Low Risk  (09/11/2022)   Received from [facility]   Overall Financial Resource Strain (CARDIA)    Difficulty of Paying Living Expenses: Not hard at all  Food Insecurity: No Food Insecurity (09/11/2022)   Received from [facility], [facility]   Hunger Vital Sign    Worried About Running Out of Food in  the Last Year: Never true    Ran Out of Food in the Last Year: Never true  Transportation Needs: No Transportation Needs (09/11/2022)   Received from [facility], [facility]   [facility] - Transportation    Lack of Transportation (Medical): No    Lack of Transportation (Non-Medical): No  Physical Activity: Sufficiently Active (09/11/2022)   Received from [facility], [facility]   Exercise Vital Sign    Days of Exercise per Week: 3 days    Minutes of Exercise per Session: 50 min  Stress: No Stress Concern Present (09/11/2022)   Received from  [facility], [facility] - Occupational Stress Questionnaire    Feeling of Stress : Not at all  Social Connections: Moderately Integrated (09/11/2022)   Received from [facility], [facility]   Social Network    How would you rate your social network (family, work, friends)?: Adequate participation with social networks   Allergies  Allergen Reactions   Shellfish Allergy Anaphylaxis   Erythromycin Nausea And Vomiting and Rash    Severe   Sulfa Antibiotics Rash   No family history on file.  Current Outpatient Medications (Endocrine & Metabolic):    levothyroxine (SYNTHROID) 100 MCG tablet, TAKE 1 TABLET BY MOUTH ONCE DAILY BEFORE BREAKFAST   metFORMIN (GLUCOPHAGE) 1000 MG tablet, Take 1,000 mg by mouth 2 (two) times daily with a meal.   Semaglutide,0.25 or 0.5MG /DOS, (OZEMPIC, 0.25 OR 0.5 MG/DOSE,) 2 MG/3ML SOPN, Inject into the skin.   sitaGLIPtin (JANUVIA) 100 MG tablet, Take 100 mg by mouth daily.  Current Outpatient Medications (Cardiovascular):    lisinopril-hydrochlorothiazide (ZESTORETIC) 20-25 MG tablet, Take 1 tablet by mouth daily.  Current Outpatient Medications (Respiratory):    montelukast (SINGULAIR) 10 MG tablet, Take 1 tablet (10 mg total) by mouth at bedtime.  Current Outpatient Medications (Analgesics):    allopurinol (ZYLOPRIM) 100 MG tablet, Take 2 tablets by mouth once daily   aspirin EC 325 MG tablet, Take 325 mg by mouth daily.   ibuprofen (ADVIL,MOTRIN) 800 MG tablet, Take 1 tablet (800 mg total) by mouth every 8 (eight) hours as needed for pain.   Current Outpatient Medications (Other):    DULoxetine (CYMBALTA) 60 MG capsule, Take 1 capsule by mouth once daily   pregabalin (LYRICA) 150 MG capsule, Take 150 mg by mouth 2 (two) times daily.   Vitamin D, Ergocalciferol, (DRISDOL) 1.25 MG (50000 UNIT) CAPS capsule, Take 1 capsule by mouth once a week   Reviewed prior external information including notes and  imaging from  primary care provider As well as notes that were available from care everywhere and other healthcare systems.  Past medical history, social, surgical and family history all reviewed in electronic medical record.  No pertanent information unless stated regarding to the chief complaint.   Review of Systems:  No  visual changes, nausea, vomiting, diarrhea, constipation, dizziness, abdominal pain, skin rash, fevers, chills, night sweats, weight loss, swollen lymph nodes, body aches, joint swelling, chest pain, shortness of breath, mood changes. POSITIVE muscle aches, headache  Objective  Blood pressure 114/82, pulse 87, height 5\' 5"  (1.651 m), weight 156 lb (70.8 kg), SpO2 95%.   General: No apparent distress alert and oriented x3 mood and affect normal, dressed appropriately.  HEENT: Pupils equal, extraocular movements intact  Respiratory: Patient's speak in full sentences and does not appear short of breath  Cardiovascular: No lower extremity edema, non tender, no erythema  Tightness noted in the  neck.  Does have to have some discomfort and pain at baseline.  Patient does have positive tightness at the occipital region.   Osteopathic findings C2 flexed rotated and side bent right C4 flexed rotated and side bent left C6 flexed rotated and side bent left T3 extended rotated and side bent right inhaled third rib T8 extended rotated and side bent left    Impression and Recommendations:    The above documentation has been reviewed and is accurate and complete Judi Saa, DO  Fibromyalgia Excessive headaches now. Discussed HEP  Has had difficulty with iron and vitamin D. Attempted OMT.  If worsneing need to check further labs again  See me again in 6-8 weeks     Decision today to treat with OMT was based on Physical Exam  After verbal consent patient was treated with HVLA, ME, FPR techniques in cervical, thoracic, rib areas, all areas are chronic   Patient  tolerated the procedure well with improvement in symptoms  Patient given exercises, stretches and lifestyle modifications  See medications in patient instructions if given  Patient will follow up in 4-8 weeks

## 2023-07-10 ENCOUNTER — Encounter: Payer: Self-pay | Admitting: Family Medicine

## 2023-07-10 ENCOUNTER — Ambulatory Visit: Payer: BC Managed Care – PPO | Admitting: Family Medicine

## 2023-07-10 VITALS — BP 114/82 | HR 87 | Ht 65.0 in | Wt 156.0 lb

## 2023-07-10 DIAGNOSIS — M9904 Segmental and somatic dysfunction of sacral region: Secondary | ICD-10-CM | POA: Diagnosis not present

## 2023-07-10 DIAGNOSIS — R790 Abnormal level of blood mineral: Secondary | ICD-10-CM | POA: Diagnosis not present

## 2023-07-10 DIAGNOSIS — M9902 Segmental and somatic dysfunction of thoracic region: Secondary | ICD-10-CM | POA: Diagnosis not present

## 2023-07-10 DIAGNOSIS — R7989 Other specified abnormal findings of blood chemistry: Secondary | ICD-10-CM | POA: Diagnosis not present

## 2023-07-10 DIAGNOSIS — M797 Fibromyalgia: Secondary | ICD-10-CM

## 2023-07-10 DIAGNOSIS — M9908 Segmental and somatic dysfunction of rib cage: Secondary | ICD-10-CM | POA: Diagnosis not present

## 2023-07-10 DIAGNOSIS — M9901 Segmental and somatic dysfunction of cervical region: Secondary | ICD-10-CM

## 2023-07-10 NOTE — Assessment & Plan Note (Signed)
 Excessive headaches now. Discussed HEP  Has had difficulty with iron and vitamin D. Attempted OMT.  If worsneing need to check further labs again  See me again in 6-8 weeks

## 2023-07-10 NOTE — Patient Instructions (Signed)
 See me again in 2 months Scapular exercises Talk to your doc about Ozempic

## 2023-07-26 ENCOUNTER — Other Ambulatory Visit: Payer: Self-pay | Admitting: Family Medicine

## 2023-08-26 ENCOUNTER — Other Ambulatory Visit: Payer: Self-pay | Admitting: Family Medicine

## 2023-09-06 NOTE — Progress Notes (Unsigned)
 [facility] [address] [place] [address] Phone: [phone] Subjective:   Dorothy Bowen, am serving as a scribe for Dr. Ronnell Coins.  I'm seeing this patient by the request  of:  Gerrianne Krauss, PA-C  CC: Pain all over  [id number]  Dorothy Bowen is a 53 y.o. female coming in with complaint of back and neck pain. OMT on 07/10/2023. Patient states that today she is ok. Her shoulder pain has improved.   Still has trouble with daily headaches. Some last all day. Also notes pain in ears when swallowing which started in the past 2 weeks.   Also complains of lateral hip pain on the L side. Pain has been there for a while but seems to wax and wain. Wonders if transitioning into menopause is causing symptoms. Feels fatigued as well. Wonders if iron  is low.   Medications patient has been prescribed: synthroid  cymbalta   Taking:         Reviewed prior external information including notes and imaging from previsou exam, outside providers and external EMR if available.   As well as notes that were available from care everywhere and other healthcare systems.  Past medical history, social, surgical and family history all reviewed in electronic medical record.  No pertanent information unless stated regarding to the chief complaint.   Past Medical History:  Diagnosis Date   Abnormal uterine bleeding (AUB)    Diabetes mellitus without complication (HCC)    Endometrial polyp    Hypertension    Iron  deficiency anemia    MRSA infection    right hip from a spider bite, approx 13 years ago   Thyroid  disease    Wears glasses     Allergies  Allergen Reactions   Shellfish Allergy Anaphylaxis   Erythromycin Nausea And Vomiting and Rash    Severe   Sulfa Antibiotics Rash     Review of Systems:  No  visual changes, nausea, vomiting, diarrhea, constipation, dizziness, abdominal pain, skin rash, fevers, chills, night sweats, weight loss,  swollen lymph nodes, joint swelling, chest pain, shortness of breath, mood changes. POSITIVE muscle aches, body aches, fatigue, headaches  Objective  Blood pressure 98/72, pulse 75, height 5\' 5"  (1.651 m), weight 151 lb (68.5 kg), SpO2 97%.   General: No apparent distress alert and oriented x3 mood and affect normal, dressed appropriately.  HEENT: Pupils equal, extraocular movements intact  Respiratory: Patient's speak in full sentences and does not appear short of breath  Cardiovascular: No lower extremity edema, non tender, no erythema  Gait MSK:  Back   Osteopathic findings  C3 flexed rotated and side bent right C6 flexed rotated and side bent left T3 extended rotated and side bent right inhaled rib T5 extended rotated and side bent left L3 flexed rotated and side bent right Sacrum right on right     Assessment and Plan:  Low ferritin Recheck low ferritin  Low vitamin D  level Recheck low vitamin D   Fibromyalgia Has responded relatively well to the Cymbalta  and the higher thyroid  medication.  I do believe the patient is making progress we are just not making 100% improvement.  Likely now both the pain is not stopping her from family activities.  Patient has done extremely well with her weight as well which I think has been significantly helpful.  Patient will follow-up with me again in 6 to 8 weeks New medication given including Celebrex to see if this will be more beneficial 100 mg  twice a day  Nonallopathic problems  Decision today to treat with OMT was based on Physical Exam  After verbal consent patient was treated with HVLA, ME, FPR techniques in cervical, rib, thoracic, lumbar, and sacral  areas  Patient tolerated the procedure well with improvement in symptoms  Patient given exercises, stretches and lifestyle modifications  See medications in patient instructions if given  Patient will follow up in 4-8 weeks    The above documentation has been reviewed and  is accurate and complete Isidro Margo, DO          Note: This dictation was prepared with Dragon dictation along with smaller phrase technology. Any transcriptional errors that result from this process are unintentional.

## 2023-09-08 IMAGING — MR MR PELVIS W/O CM
4 of 6 series · 29 of 48 positions shown · non-contrast
Comparison: None.

CLINICAL DATA: Sacrococcygeal pain for 10 months.  No known injury.

EXAM:
MRI PELVIS WITHOUT CONTRAST
TECHNIQUE: Multiplanar multisequence MR imaging of the pelvis was performed. No
intravenous contrast was administered.

[Series 3: T2 fat-sat · sagittal · 4.0mm · 0.47mm/px · 11 of 62 slices shown]
[im 1/62]
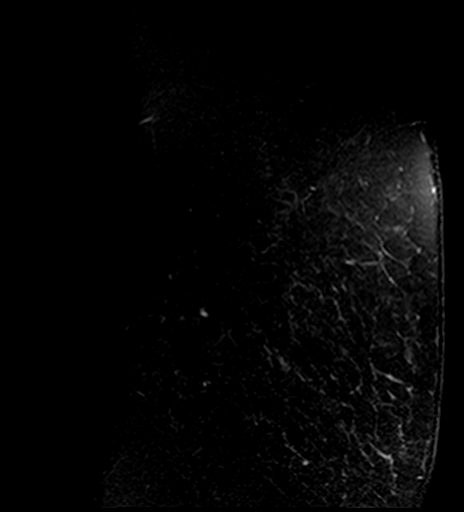
[im 7/62]
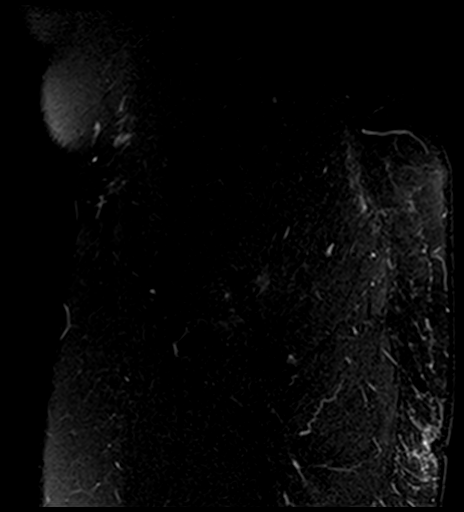
[im 13/62]
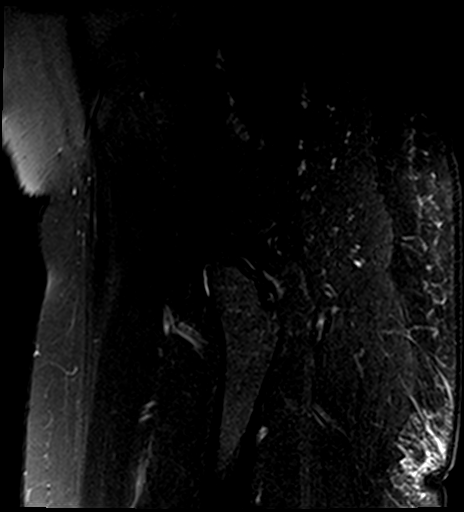
[im 19/62]
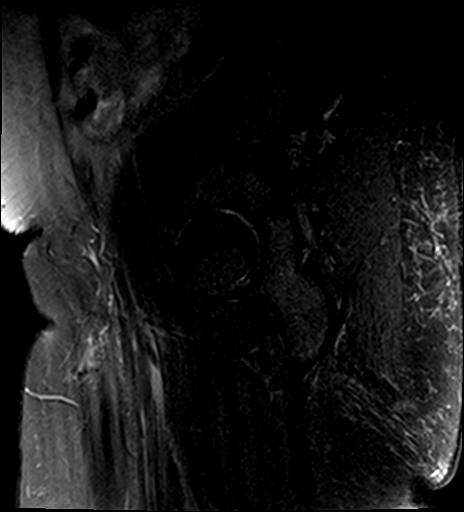
[im 25/62]
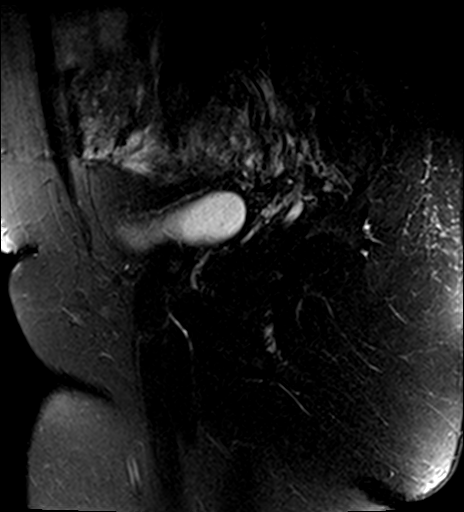
[im 31/62]
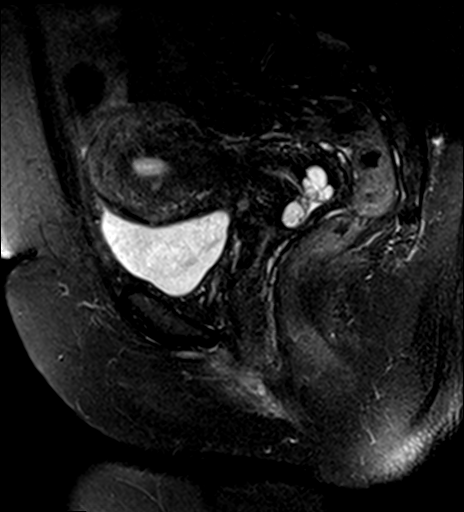
[im 37/62]
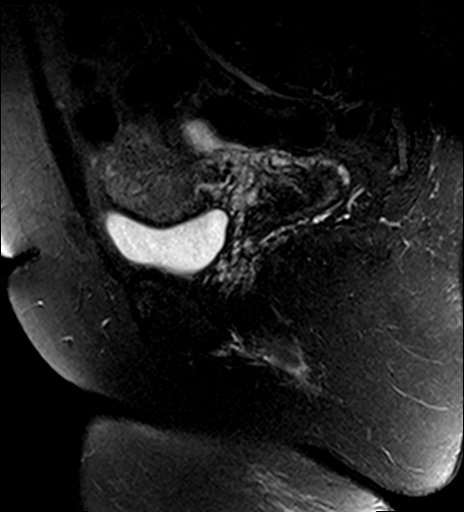
[im 43/62]
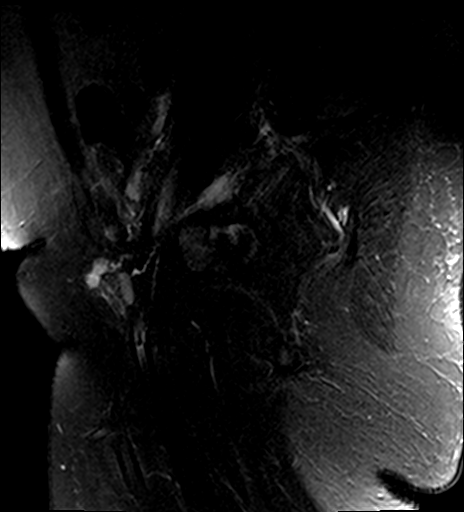
[im 49/62]
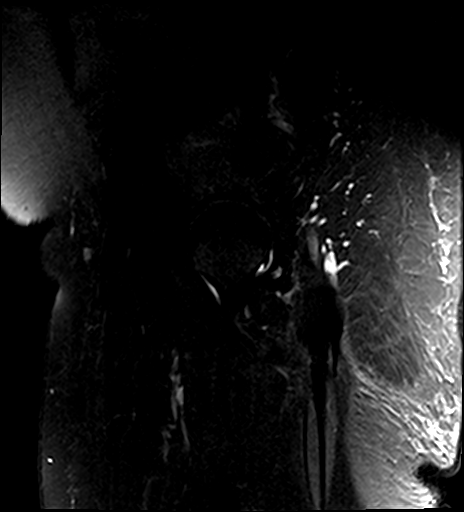
[im 55/62]
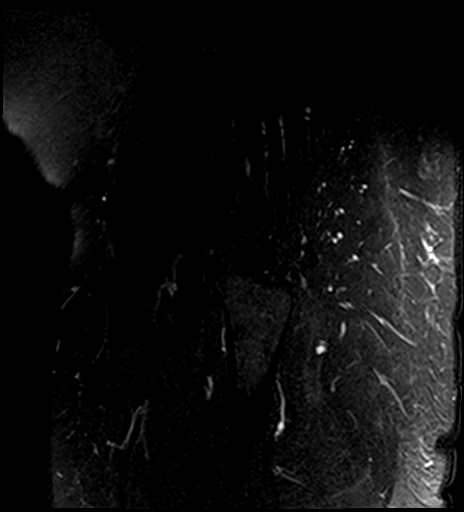
[im 62/62]
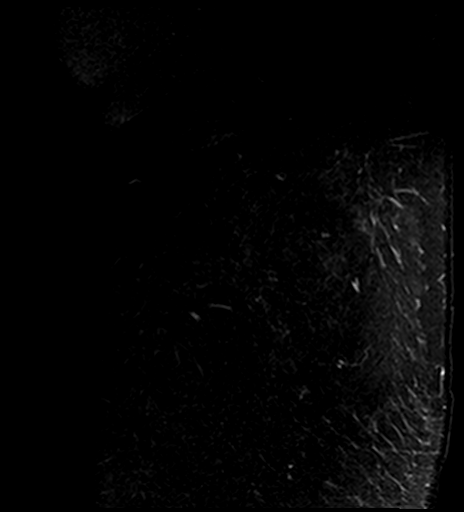

[Series 4: T1 · coronal · 4.0mm · 1.56mm/px · 7 of 36 slices shown (1 of 2)]
[im 1/36]
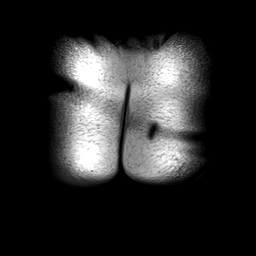
[im 6/36]
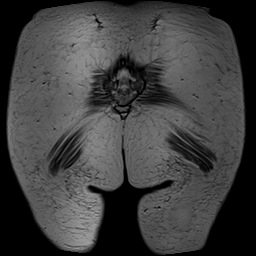
[im 12/36]
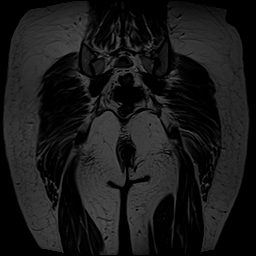
[im 18/36]
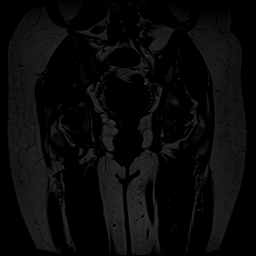
[im 24/36]
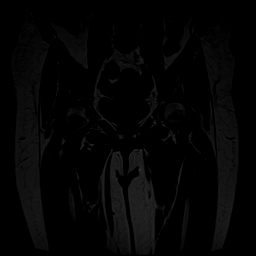
[im 30/36]
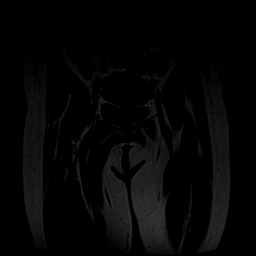
[im 36/36]
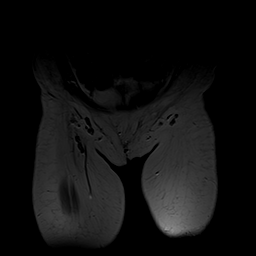

[Series 5: STIR · coronal · 4.0mm · 1.56mm/px · 6 of 34 slices shown]
[im 1/34]
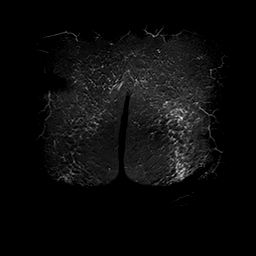
[im 7/34]
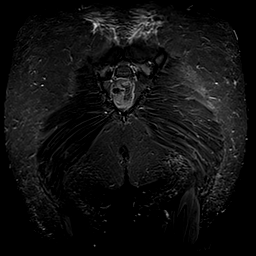
[im 14/34]
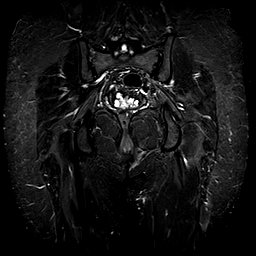
[im 20/34]
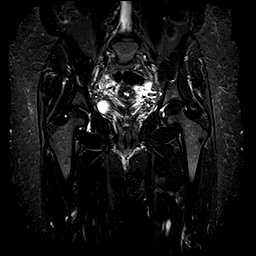
[im 27/34]
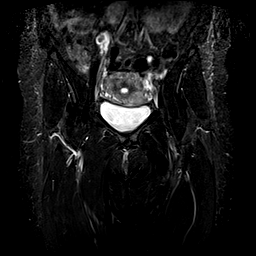
[im 34/34]
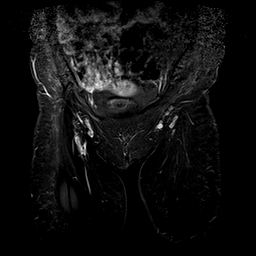

[Series 6: T1 · axial · 4.0mm · 0.59mm/px · z∈[+28,+203]mm · 5 of 42 slices shown (2 of 2)]
[im 1/42]
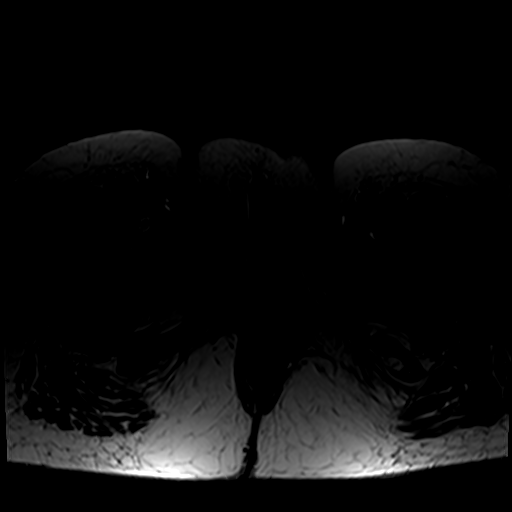
[im 6/42]
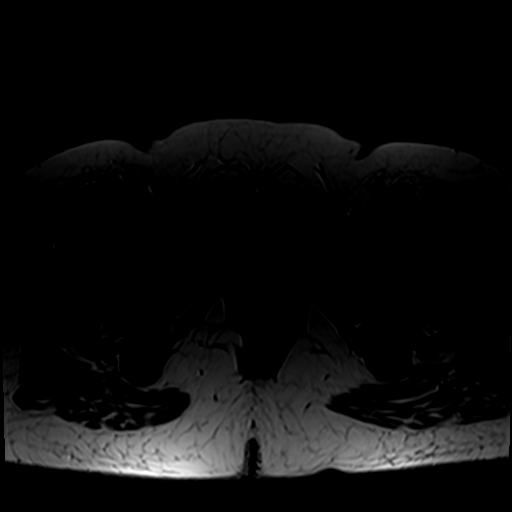
[im 12/42]
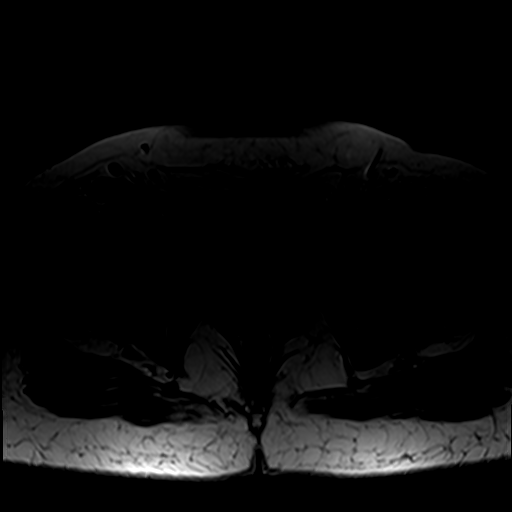
[im 24/42]
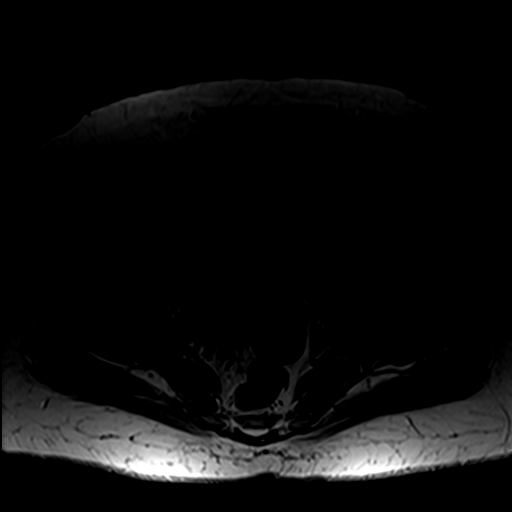
[im 36/42]
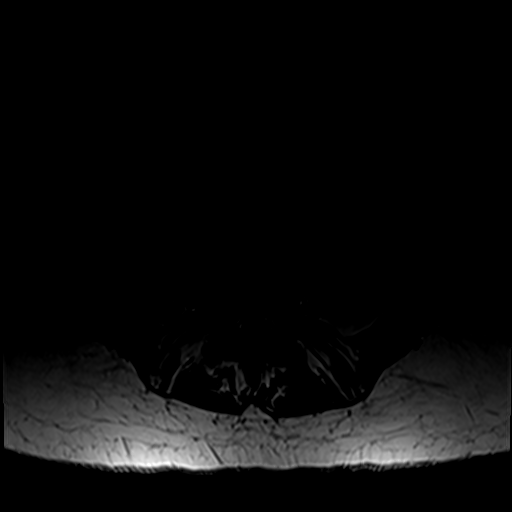

[29 of 48 positions shown; findings below may reference images not displayed]

FINDINGS: Bones/Joint/Cartilage

No acute fracture. No dislocation. SI joints intact without
diastasis. No significant arthropathy of the sacroiliac joints. No
erosion or SI joint effusion. No significant arthropathy of the
bilateral hips. No hip joint effusion. No femoral head avascular
necrosis. Bony pelvis intact without diastasis. Pubic symphysis
within normal limits. No bone marrow edema. No marrow replacing bone
lesion.

Ligaments

Intact.

Muscles and Tendons

Musculotendinous structures within normal limits.

Soft tissues

No soft tissue edema or fluid collection. No P sacral edema or mass.
Mildly enlarged, heterogeneous uterus. Numerous nabothian cysts at
the level of the cervix. No inguinal lymphadenopathy.
IMPRESSION: 1. No acute osseous abnormality of the pelvis or bilateral hips.
2. No findings to explain patient's sacrococcygeal pain.
3. Mildly enlarged, heterogeneous uterus.

## 2023-09-10 ENCOUNTER — Ambulatory Visit (INDEPENDENT_AMBULATORY_CARE_PROVIDER_SITE_OTHER): Payer: BC Managed Care – PPO | Admitting: Family Medicine

## 2023-09-10 VITALS — BP 98/72 | HR 75 | Ht 65.0 in | Wt 151.0 lb

## 2023-09-10 DIAGNOSIS — M797 Fibromyalgia: Secondary | ICD-10-CM

## 2023-09-10 DIAGNOSIS — M9904 Segmental and somatic dysfunction of sacral region: Secondary | ICD-10-CM

## 2023-09-10 DIAGNOSIS — M2550 Pain in unspecified joint: Secondary | ICD-10-CM | POA: Diagnosis not present

## 2023-09-10 DIAGNOSIS — M9901 Segmental and somatic dysfunction of cervical region: Secondary | ICD-10-CM

## 2023-09-10 DIAGNOSIS — R790 Abnormal level of blood mineral: Secondary | ICD-10-CM | POA: Diagnosis not present

## 2023-09-10 DIAGNOSIS — M9903 Segmental and somatic dysfunction of lumbar region: Secondary | ICD-10-CM

## 2023-09-10 DIAGNOSIS — M9908 Segmental and somatic dysfunction of rib cage: Secondary | ICD-10-CM

## 2023-09-10 DIAGNOSIS — R7989 Other specified abnormal findings of blood chemistry: Secondary | ICD-10-CM | POA: Diagnosis not present

## 2023-09-10 DIAGNOSIS — M9902 Segmental and somatic dysfunction of thoracic region: Secondary | ICD-10-CM

## 2023-09-10 LAB — CBC WITH DIFFERENTIAL/PLATELET
Basophils Absolute: 0 10*3/uL (ref 0.0–0.1)
Basophils Relative: 0.7 % (ref 0.0–3.0)
Eosinophils Absolute: 0.2 10*3/uL (ref 0.0–0.7)
Eosinophils Relative: 3.9 % (ref 0.0–5.0)
HCT: 43 % (ref 36.0–46.0)
Hemoglobin: 14.4 g/dL (ref 12.0–15.0)
Lymphocytes Relative: 26.3 % (ref 12.0–46.0)
Lymphs Abs: 1.7 10*3/uL (ref 0.7–4.0)
MCHC: 33.5 g/dL (ref 30.0–36.0)
MCV: 88.5 fl (ref 78.0–100.0)
Monocytes Absolute: 0.5 10*3/uL (ref 0.1–1.0)
Monocytes Relative: 7.2 % (ref 3.0–12.0)
Neutro Abs: 3.9 10*3/uL (ref 1.4–7.7)
Neutrophils Relative %: 61.9 % (ref 43.0–77.0)
Platelets: 337 10*3/uL (ref 150.0–400.0)
RBC: 4.86 Mil/uL (ref 3.87–5.11)
RDW: 13.5 % (ref 11.5–15.5)
WBC: 6.3 10*3/uL (ref 4.0–10.5)

## 2023-09-10 LAB — FERRITIN: Ferritin: 73.8 ng/mL (ref 10.0–291.0)

## 2023-09-10 LAB — IBC PANEL
Iron: 74 ug/dL (ref 42–145)
Saturation Ratios: 22.4 % (ref 20.0–50.0)
TIBC: 330.4 ug/dL (ref 250.0–450.0)
Transferrin: 236 mg/dL (ref 212.0–360.0)

## 2023-09-10 LAB — VITAMIN D 25 HYDROXY (VIT D DEFICIENCY, FRACTURES): VITD: 40.24 ng/mL (ref 30.00–100.00)

## 2023-09-10 MED ORDER — CELECOXIB 100 MG PO CAPS
100.0000 mg | ORAL_CAPSULE | Freq: Two times a day (BID) | ORAL | 0 refills | Status: AC
Start: 2023-09-10 — End: ?

## 2023-09-10 NOTE — Patient Instructions (Addendum)
 Celebrex 100mg  2x a day Stop IBU Labs today See me 2-3 months

## 2023-09-11 ENCOUNTER — Encounter: Payer: Self-pay | Admitting: Family Medicine

## 2023-09-11 NOTE — Assessment & Plan Note (Signed)
 Recheck low ferritin

## 2023-09-11 NOTE — Assessment & Plan Note (Signed)
 Has responded relatively well to the Cymbalta  and the higher thyroid  medication.  I do believe the patient is making progress we are just not making 100% improvement.  Likely now both the pain is not stopping her from family activities.  Patient has done extremely well with her weight as well which I think has been significantly helpful.  Patient will follow-up with me again in 6 to 8 weeks

## 2023-09-11 NOTE — Assessment & Plan Note (Signed)
 Recheck low vitamin D 

## 2023-09-24 ENCOUNTER — Other Ambulatory Visit: Payer: Self-pay | Admitting: Family Medicine

## 2023-10-03 ENCOUNTER — Other Ambulatory Visit: Payer: Self-pay | Admitting: Family Medicine

## 2023-11-04 ENCOUNTER — Other Ambulatory Visit: Payer: Self-pay | Admitting: Family Medicine

## 2023-11-04 NOTE — Progress Notes (Unsigned)
 [facility] [address] [place] [address] Phone: [phone] Subjective:   Dorothy Bowen, am serving as a scribe for Dr. Arthea Claudene.  I'm seeing this patient by the request  of:  Samie Frederick, PA-C  CC: Back and neck pain follow-up  YEP:Dlagzrupcz  Dorothy Bowen is a 53 y.o. female coming in with complaint of back and neck pain. OMT on 10/10/2023. Patient states that R side of neck was painful a few days ago. Causing headaches. No new pain though since last visit.   Medications patient has been prescribed: cymbalta  celebrex  synthroid   Taking:         Reviewed prior external information including notes and imaging from previsou exam, outside providers and external EMR if available.   As well as notes that were available from care everywhere and other healthcare systems.  Past medical history, social, surgical and family history all reviewed in electronic medical record.  No pertanent information unless stated regarding to the chief complaint.   Past Medical History:  Diagnosis Date   Abnormal uterine bleeding (AUB)    Diabetes mellitus without complication (HCC)    Endometrial polyp    Hypertension    Iron  deficiency anemia    MRSA infection    right hip from a spider bite, approx 13 years ago   Thyroid  disease    Wears glasses     Allergies  Allergen Reactions   Shellfish Allergy Anaphylaxis   Erythromycin Nausea And Vomiting and Rash    Severe   Sulfa Antibiotics Rash     Review of Systems:  No headache, visual changes, nausea, vomiting, diarrhea, constipation, dizziness, abdominal pain, skin rash, fevers, chills, night sweats, weight loss, swollen lymph nodes, body aches, joint swelling, chest pain, shortness of breath, mood changes. POSITIVE muscle aches  Objective  Blood pressure 98/68, pulse 65, height 5' 5 (1.651 m), weight 147 lb (66.7 kg), SpO2 96%.   General: No apparent distress alert and oriented  x3 mood and affect normal, dressed appropriately.  HEENT: Pupils equal, extraocular movements intact  Respiratory: Patient's speak in full sentences and does not appear short of breath  Cardiovascular: No lower extremity edema, non tender, no erythema  Gait MSK:  Back   Osteopathic findings  C2 flexed rotated and side bent right C6 flexed rotated and side bent left T3 extended rotated and side bent right inhaled rib T9 extended rotated and side bent left L2 flexed rotated and side bent right L3 flexed rotated and side bent left L5 flexed rotated and side bent right Sacrum right on right       Assessment and Plan:  Coccydynia Have been making progress with patient's supplementation on all the low level she has had.  Still working on patient's diabetes.  Do feel the underlying fibromyalgia can exacerbate some of her discomfort and pain as well.  After further evaluation today attempted osteopathic manipulation again.  Hopeful that this will make improvement.  Follow-up with me again in 6 to 8 weeks.    Nonallopathic problems  Decision today to treat with OMT was based on Physical Exam  After verbal consent patient was treated with HVLA, ME, FPR techniques in cervical, rib, thoracic, lumbar, and sacral  areas  Patient tolerated the procedure well with improvement in symptoms  Patient given exercises, stretches and lifestyle modifications  See medications in patient instructions if given  Patient will follow up in 4-8 weeks    The above documentation has  been reviewed and is accurate and complete Dorothy Mceachron M Mariya Mottley, DO          Note: This dictation was prepared with Dragon dictation along with smaller phrase technology. Any transcriptional errors that result from this process are unintentional.

## 2023-11-11 ENCOUNTER — Ambulatory Visit (INDEPENDENT_AMBULATORY_CARE_PROVIDER_SITE_OTHER): Admitting: Family Medicine

## 2023-11-11 VITALS — BP 98/68 | HR 65 | Ht 65.0 in | Wt 147.0 lb

## 2023-11-11 DIAGNOSIS — M9901 Segmental and somatic dysfunction of cervical region: Secondary | ICD-10-CM

## 2023-11-11 DIAGNOSIS — M549 Dorsalgia, unspecified: Secondary | ICD-10-CM

## 2023-11-11 DIAGNOSIS — M9902 Segmental and somatic dysfunction of thoracic region: Secondary | ICD-10-CM

## 2023-11-11 DIAGNOSIS — M797 Fibromyalgia: Secondary | ICD-10-CM | POA: Diagnosis not present

## 2023-11-11 DIAGNOSIS — M533 Sacrococcygeal disorders, not elsewhere classified: Secondary | ICD-10-CM

## 2023-11-11 DIAGNOSIS — M9903 Segmental and somatic dysfunction of lumbar region: Secondary | ICD-10-CM | POA: Diagnosis not present

## 2023-11-11 DIAGNOSIS — M542 Cervicalgia: Secondary | ICD-10-CM | POA: Diagnosis not present

## 2023-11-11 DIAGNOSIS — M9904 Segmental and somatic dysfunction of sacral region: Secondary | ICD-10-CM | POA: Diagnosis not present

## 2023-11-11 DIAGNOSIS — M2550 Pain in unspecified joint: Secondary | ICD-10-CM

## 2023-11-11 DIAGNOSIS — M9908 Segmental and somatic dysfunction of rib cage: Secondary | ICD-10-CM | POA: Diagnosis not present

## 2023-11-11 MED ORDER — NABUMETONE 750 MG PO TABS: 750.0000 mg | ORAL_TABLET | Freq: Every day | ORAL | 0 refills | Status: AC

## 2023-11-11 NOTE — Patient Instructions (Addendum)
 Stop celebrex  Labs today Prescription sent to pharmacy See you again in 2-3 months

## 2023-11-12 ENCOUNTER — Encounter: Payer: Self-pay | Admitting: Family Medicine

## 2023-11-12 NOTE — Assessment & Plan Note (Signed)
 Have been making progress with patient's supplementation on all the low level she has had.  Still working on patient's diabetes.  Do feel the underlying fibromyalgia can exacerbate some of her discomfort and pain as well.  After further evaluation today attempted osteopathic manipulation again.  Hopeful that this will make improvement.  Follow-up with me again in 6 to 8 weeks.

## 2023-11-12 NOTE — Assessment & Plan Note (Signed)
 After further discussion the patient was having difficulty and did not feel the Celebrex  was making any significant improvement.  Given prescription for nabumetone to see if this would be more helpful.  Patient has had some mild increase in upper back pain so we will get a urinalysis as well.

## 2023-11-13 ENCOUNTER — Other Ambulatory Visit: Payer: Self-pay | Admitting: Family Medicine

## 2023-11-13 LAB — URINALYSIS W MICROSCOPIC + REFLEX CULTURE
Bacteria, UA: NONE SEEN /HPF
Bilirubin Urine: NEGATIVE
Glucose, UA: NEGATIVE
Hgb urine dipstick: NEGATIVE
Hyaline Cast: NONE SEEN /LPF
Ketones, ur: NEGATIVE
Nitrites, Initial: NEGATIVE
Protein, ur: NEGATIVE
RBC / HPF: NONE SEEN /HPF (ref 0–2)
Specific Gravity, Urine: 1.03 (ref 1.001–1.035)
pH: 6.5 (ref 5.0–8.0)

## 2023-11-13 LAB — URINE CULTURE
MICRO NUMBER:: 16645994
Result:: NO GROWTH
SPECIMEN QUALITY:: ADEQUATE

## 2023-11-13 LAB — CULTURE INDICATED

## 2023-11-30 ENCOUNTER — Other Ambulatory Visit: Payer: Self-pay | Admitting: Family Medicine

## 2023-12-05 ENCOUNTER — Other Ambulatory Visit: Payer: Self-pay | Admitting: Family Medicine

## 2023-12-29 ENCOUNTER — Other Ambulatory Visit: Payer: Self-pay | Admitting: Family Medicine

## 2024-01-16 NOTE — Progress Notes (Unsigned)
 [facility] [address] [place] [address] Phone: [phone] Subjective:   LILLETTE Berwyn Posey, am serving as a scribe for Dr. Arthea Claudene.  I'm seeing this patient by the request  of:  Samie Frederick, PA-C  CC: Pain all over  YEP:Dlagzrupcz  Mareta Jahmiyah Dullea is a 53 y.o. female coming in with complaint of back and neck pain. OMT on 11/11/2023. Patient states that she has been having good and bad days. Having pain in LUQ for a few weeks over the ribs. Certain movements bother this area. Pain can occur at rest as well.   Medications patient has been prescribed: Vit D cymbalta  synthroid  relafen   Taking:         Reviewed prior external information including notes and imaging from previsou exam, outside providers and external EMR if available.   As well as notes that were available from care everywhere and other healthcare systems.  Past medical history, social, surgical and family history all reviewed in electronic medical record.  No pertanent information unless stated regarding to the chief complaint.   Past Medical History:  Diagnosis Date   Abnormal uterine bleeding (AUB)    Diabetes mellitus without complication (HCC)    Endometrial polyp    Hypertension    Iron  deficiency anemia    MRSA infection    right hip from a spider bite, approx 13 years ago   Thyroid  disease    Wears glasses     Allergies  Allergen Reactions   Shellfish Allergy Anaphylaxis   Erythromycin Nausea And Vomiting and Rash    Severe   Sulfa Antibiotics Rash     Review of Systems:  No headache, visual changes, nausea, vomiting, diarrhea, constipation, dizziness, abdominal pain, skin rash, fevers, chills, night sweats, weight loss, swollen lymph nodes, body aches, joint swelling, chest pain, shortness of breath, mood changes. POSITIVE muscle aches  Objective  Blood pressure 102/70, pulse 76, height 5' 5 (1.651 m), weight 151 lb (68.5 kg), SpO2 98%.    General: No apparent distress alert and oriented x3 mood and affect normal, dressed appropriately.  HEENT: Pupils equal, extraocular movements intact  Respiratory: Patient's speak in full sentences and does not appear short of breath  Cardiovascular: No lower extremity edema, non tender, no erythema  Gait MSK:  Back does have some loss lordosis noted.  Some tenderness to palpation in the paraspinal musculature.  Seems to be diffuse.  Out of proportion to the amount of palpation.  Osteopathic findings  C6 flexed rotated and side bent left T3 extended rotated and side bent right inhaled rib T9 extended rotated and side bent left L2 flexed rotated and side bent right Sacrum right on right       Assessment and Plan:  Fibromyalgia Continue to have chronic difficulty noted.  Discussed icing regimen and home exercises, discussed which activities to do and which ones to avoid.  Increase activity slowly.  Discussed with patient about different treatment.  Patient would like to try to keep things neutral if possible.  We discussed over-the-counter medications and we can consider.  Discussed icing regimen and home exercises,  Chronic cough Chronic cough discussed potential Qvar .  Needed further workup.  Patient cannot see any providers.  Has a primary care provider.  Will consider it.    Nonallopathic problems  Decision today to treat with OMT was based on Physical Exam  After verbal consent patient was treated with HVLA, ME, FPR techniques in cervical, rib, thoracic,  lumbar, and sacral  areas  Patient tolerated the procedure well with improvement in symptoms  Patient given exercises, stretches and lifestyle modifications  See medications in patient instructions if given  Patient will follow up in 4-8 weeks    The above documentation has been reviewed and is accurate and complete Nguyet Mercer M Macio Kissoon, DO          Note: This dictation was prepared with Dragon dictation along with  smaller phrase technology. Any transcriptional errors that result from this process are unintentional.

## 2024-01-17 ENCOUNTER — Ambulatory Visit: Admitting: Family Medicine

## 2024-01-17 ENCOUNTER — Encounter: Payer: Self-pay | Admitting: Family Medicine

## 2024-01-17 VITALS — BP 102/70 | HR 76 | Ht 65.0 in | Wt 151.0 lb

## 2024-01-17 DIAGNOSIS — M9903 Segmental and somatic dysfunction of lumbar region: Secondary | ICD-10-CM | POA: Diagnosis not present

## 2024-01-17 DIAGNOSIS — M797 Fibromyalgia: Secondary | ICD-10-CM

## 2024-01-17 DIAGNOSIS — M9901 Segmental and somatic dysfunction of cervical region: Secondary | ICD-10-CM | POA: Diagnosis not present

## 2024-01-17 DIAGNOSIS — R790 Abnormal level of blood mineral: Secondary | ICD-10-CM

## 2024-01-17 DIAGNOSIS — R7989 Other specified abnormal findings of blood chemistry: Secondary | ICD-10-CM | POA: Diagnosis not present

## 2024-01-17 DIAGNOSIS — M9908 Segmental and somatic dysfunction of rib cage: Secondary | ICD-10-CM | POA: Diagnosis not present

## 2024-01-17 DIAGNOSIS — R053 Chronic cough: Secondary | ICD-10-CM

## 2024-01-17 DIAGNOSIS — M9904 Segmental and somatic dysfunction of sacral region: Secondary | ICD-10-CM | POA: Diagnosis not present

## 2024-01-17 DIAGNOSIS — M9902 Segmental and somatic dysfunction of thoracic region: Secondary | ICD-10-CM

## 2024-01-17 MED ORDER — QVAR REDIHALER 40 MCG/ACT IN AERB: 1.0000 | INHALATION_SPRAY | Freq: Every day | RESPIRATORY_TRACT | 0 refills | Status: AC

## 2024-01-17 NOTE — Patient Instructions (Signed)
 Flonase 1 spray each nostril daily for 2 weeks DHEA 50mg  daily for one month then 2 weeks off Qvar  40mcg inhaled daily for one month If continuing to have nausea will have MRI brain See me 2 months

## 2024-01-17 NOTE — Assessment & Plan Note (Signed)
 Continue to have chronic difficulty noted.  Discussed icing regimen and home exercises, discussed which activities to do and which ones to avoid.  Increase activity slowly.  Discussed with patient about different treatment.  Patient would like to try to keep things neutral if possible.  We discussed over-the-counter medications and we can consider.  Discussed icing regimen and home exercises,

## 2024-01-17 NOTE — Assessment & Plan Note (Signed)
 Chronic cough discussed potential Qvar .  Needed further workup.  Patient cannot see any providers.  Has a primary care provider.  Will consider it.

## 2024-01-17 NOTE — Assessment & Plan Note (Signed)
 Continue over-the-counter supplementation

## 2024-01-17 NOTE — Assessment & Plan Note (Signed)
 Has had low ferritin but stable at the moment.

## 2024-02-05 ENCOUNTER — Other Ambulatory Visit: Payer: Self-pay | Admitting: Family Medicine

## 2024-03-05 ENCOUNTER — Other Ambulatory Visit: Payer: Self-pay | Admitting: Family Medicine

## 2024-03-19 ENCOUNTER — Ambulatory Visit: Admitting: Family Medicine

## 2024-03-30 NOTE — Progress Notes (Deleted)
  [facility] [address] [place] [address] Phone: [phone] Subjective:    I'm seeing this patient by the request  of:  Samie Frederick, PA-C  CC: Back and neck pain  YEP:Dlagzrupcz  Dorothy Bowen is a 53 y.o. female coming in with complaint of back and neck pain. OMT 01/17/2024. Patient states   Medications patient has been prescribed: Vit D, QVAR , Cymbalta , Celebrex   Taking:         Reviewed prior external information including notes and imaging from previsou exam, outside providers and external EMR if available.   As well as notes that were available from care everywhere and other healthcare systems.  Past medical history, social, surgical and family history all reviewed in electronic medical record.  No pertanent information unless stated regarding to the chief complaint.   Past Medical History:  Diagnosis Date   Abnormal uterine bleeding (AUB)    Diabetes mellitus without complication (HCC)    Endometrial polyp    Hypertension    Iron  deficiency anemia    MRSA infection    right hip from a spider bite, approx 13 years ago   Thyroid  disease    Wears glasses     Allergies  Allergen Reactions   Shellfish Allergy Anaphylaxis   Erythromycin Nausea And Vomiting and Rash    Severe   Sulfa Antibiotics Rash     Review of Systems:  No headache, visual changes, nausea, vomiting, diarrhea, constipation, dizziness, abdominal pain, skin rash, fevers, chills, night sweats, weight loss, swollen lymph nodes, body aches, joint swelling, chest pain, shortness of breath, mood changes. POSITIVE muscle aches  Objective  There were no vitals taken for this visit.   General: No apparent distress alert and oriented x3 mood and affect normal, dressed appropriately.  HEENT: Pupils equal, extraocular movements intact  Respiratory: Patient's speak in full sentences and does not appear short of breath  Cardiovascular: No lower extremity  edema, non tender, no erythema  Gait MSK:  Back   Osteopathic findings  C2 flexed rotated and side bent right C6 flexed rotated and side bent left T3 extended rotated and side bent right inhaled rib T9 extended rotated and side bent left L2 flexed rotated and side bent right Sacrum right on right       Assessment and Plan:  No problem-specific Assessment & Plan notes found for this encounter.    Nonallopathic problems  Decision today to treat with OMT was based on Physical Exam  After verbal consent patient was treated with HVLA, ME, FPR techniques in cervical, rib, thoracic, lumbar, and sacral  areas  Patient tolerated the procedure well with improvement in symptoms  Patient given exercises, stretches and lifestyle modifications  See medications in patient instructions if given  Patient will follow up in 4-8 weeks             Note: This dictation was prepared with Dragon dictation along with smaller phrase technology. Any transcriptional errors that result from this process are unintentional.

## 2024-04-02 ENCOUNTER — Ambulatory Visit: Admitting: Family Medicine

## 2024-04-02 NOTE — Progress Notes (Signed)
 [facility] [address] [place] [address] Phone: [phone] Subjective:   LILLETTE Berwyn Posey, am serving as a scribe for Dr. Arthea Claudene.  I'm seeing this patient by the request  of:  Samie Frederick, PA-C  CC: Back and neck pain  YEP:Dlagzrupcz  Arthelia Deolinda Frid is a 53 y.o. female coming in with complaint of back and neck pain. OMT 01/17/2024. Patient states that her back is doing fine.   She said that she developed R hand pain for 5 weeks. Pain throughout entire hand. Pain in thumb when she extends other fingers. Pain will radiate into the forearm. More painful with use.   Medications patient has been prescribed: Vit D, QVAR , Cymbalta , Celebrex   Taking:         Reviewed prior external information including notes and imaging from previsou exam, outside providers and external EMR if available.   As well as notes that were available from care everywhere and other healthcare systems.  Past medical history, social, surgical and family history all reviewed in electronic medical record.  No pertanent information unless stated regarding to the chief complaint.   Past Medical History:  Diagnosis Date   Abnormal uterine bleeding (AUB)    Diabetes mellitus without complication (HCC)    Endometrial polyp    Hypertension    Iron  deficiency anemia    MRSA infection    right hip from a spider bite, approx 13 years ago   Thyroid  disease    Wears glasses     Allergies  Allergen Reactions   Shellfish Allergy Anaphylaxis   Erythromycin Nausea And Vomiting and Rash    Severe   Sulfa Antibiotics Rash     Review of Systems:  No headache, visual changes, nausea, vomiting, diarrhea, constipation, dizziness, abdominal pain, skin rash, fevers, chills, night sweats, weight loss, swollen lymph nodes, body aches, joint swelling, chest pain, shortness of breath, mood changes. POSITIVE muscle aches  Objective  Blood pressure 130/88, pulse 78,  height 5' 5 (1.651 m), weight 161 lb (73 kg), SpO2 97%.   General: No apparent distress alert and oriented x3 mood and affect normal, dressed appropriately.  HEENT: Pupils equal, extraocular movements intact  Respiratory: Patient's speak in full sentences and does not appear short of breath  Cardiovascular: No lower extremity edema, non tender, no erythema  Gait MSK:  Back does have some loss lordosis noted.  Some tenderness to palpation noted.  Tightness seems to be in the thoracolumbar juncture more. Right wrist positive Tinel's noted.  No thenar eminence wasting noted.  Good grip strength noted.  Osteopathic findings  C2 flexed rotated and side bent right C6 flexed rotated and side bent left T3 extended rotated and side bent right inhaled rib T11 extended rotated and side bent left L1 flexed rotated and side bent right Sacrum right on right       Assessment and Plan:  Fibromyalgia Continues to respond very well to the levothyroxine  as well as the Cymbalta .  Discussed with patient about icing regimen and home exercises, discussed which activities to do and which ones to avoid.  Increase activity slowly.  Follow-up again in 6 to 12 weeks.    Nonallopathic problems  Decision today to treat with OMT was based on Physical Exam  After verbal consent patient was treated with HVLA, ME, FPR techniques in cervical, rib, thoracic, lumbar, and sacral  areas  Patient tolerated the procedure well with improvement in symptoms  Patient given exercises, stretches  and lifestyle modifications  See medications in patient instructions if given  Patient will follow up in 4-8 weeks     The above documentation has been reviewed and is accurate and complete Loa Idler M Klaryssa Fauth, DO         Note: This dictation was prepared with Dragon dictation along with smaller phrase technology. Any transcriptional errors that result from this process are unintentional.

## 2024-04-07 ENCOUNTER — Encounter: Payer: Self-pay | Admitting: Family Medicine

## 2024-04-07 ENCOUNTER — Ambulatory Visit: Admitting: Family Medicine

## 2024-04-07 VITALS — BP 130/88 | HR 78 | Ht 65.0 in | Wt 161.0 lb

## 2024-04-07 DIAGNOSIS — M9902 Segmental and somatic dysfunction of thoracic region: Secondary | ICD-10-CM | POA: Diagnosis not present

## 2024-04-07 DIAGNOSIS — M9903 Segmental and somatic dysfunction of lumbar region: Secondary | ICD-10-CM

## 2024-04-07 DIAGNOSIS — M797 Fibromyalgia: Secondary | ICD-10-CM | POA: Diagnosis not present

## 2024-04-07 DIAGNOSIS — M9904 Segmental and somatic dysfunction of sacral region: Secondary | ICD-10-CM

## 2024-04-07 DIAGNOSIS — M9908 Segmental and somatic dysfunction of rib cage: Secondary | ICD-10-CM

## 2024-04-07 DIAGNOSIS — G5601 Carpal tunnel syndrome, right upper limb: Secondary | ICD-10-CM | POA: Diagnosis not present

## 2024-04-07 DIAGNOSIS — M9901 Segmental and somatic dysfunction of cervical region: Secondary | ICD-10-CM

## 2024-04-07 MED ORDER — DULOXETINE HCL 60 MG PO CPEP: 60.0000 mg | ORAL_CAPSULE | Freq: Every day | ORAL | 0 refills | Status: DC

## 2024-04-07 MED ORDER — LEVOTHYROXINE SODIUM 100 MCG PO TABS: 100.0000 ug | ORAL_TABLET | Freq: Every day | ORAL | 0 refills | Status: AC

## 2024-04-07 NOTE — Assessment & Plan Note (Signed)
 Continues to respond very well to the levothyroxine  as well as the Cymbalta .  Discussed with patient about icing regimen and home exercises, discussed which activities to do and which ones to avoid.  Increase activity slowly.  Follow-up again in 6 to 12 weeks.

## 2024-04-07 NOTE — Assessment & Plan Note (Signed)
 Patient given brace, home exercises, which activities to do which ones to avoid.  Increase activity slowly.  Discussed icing regimen.  Follow-up again in 6 to 12 weeks.

## 2024-04-07 NOTE — Patient Instructions (Addendum)
 Refill cymbalta  and synthroid  Carpal tunnel R brace at night See me in 3 months

## 2024-05-04 ENCOUNTER — Other Ambulatory Visit: Payer: Self-pay | Admitting: Family Medicine

## 2024-06-02 ENCOUNTER — Other Ambulatory Visit: Payer: Self-pay | Admitting: Family Medicine

## 2024-07-08 ENCOUNTER — Ambulatory Visit: Admitting: Family Medicine
